# Patient Record
Sex: Female | Born: 1935 | Race: White | Hispanic: No | Smoking: Former smoker
Health system: Southern US, Community
[De-identification: ages and names within clinical notes are randomized; demographics above are authoritative.]

## PROBLEM LIST (undated history)

## (undated) DIAGNOSIS — I10 Essential (primary) hypertension: Secondary | ICD-10-CM

## (undated) DIAGNOSIS — I2510 Atherosclerotic heart disease of native coronary artery without angina pectoris: Secondary | ICD-10-CM

## (undated) DIAGNOSIS — F329 Major depressive disorder, single episode, unspecified: Secondary | ICD-10-CM

## (undated) DIAGNOSIS — F32A Depression, unspecified: Secondary | ICD-10-CM

## (undated) DIAGNOSIS — F29 Unspecified psychosis not due to a substance or known physiological condition: Secondary | ICD-10-CM

## (undated) DIAGNOSIS — M1990 Unspecified osteoarthritis, unspecified site: Secondary | ICD-10-CM

## (undated) HISTORY — PX: CORONARY ANGIOPLASTY: SHX604

## (undated) HISTORY — DX: Unspecified psychosis not due to a substance or known physiological condition: F29

---

## 2008-12-06 ENCOUNTER — Encounter: Payer: Self-pay | Admitting: Cardiology

## 2008-12-07 ENCOUNTER — Encounter: Payer: Self-pay | Admitting: Cardiology

## 2008-12-08 ENCOUNTER — Ambulatory Visit: Payer: Self-pay | Admitting: Cardiology

## 2008-12-08 ENCOUNTER — Inpatient Hospital Stay (HOSPITAL_COMMUNITY): Admission: AD | Admit: 2008-12-08 | Discharge: 2008-12-11 | Payer: Self-pay | Admitting: Cardiology

## 2008-12-09 ENCOUNTER — Encounter: Payer: Self-pay | Admitting: Cardiology

## 2008-12-14 ENCOUNTER — Encounter: Payer: Self-pay | Admitting: Cardiology

## 2008-12-19 ENCOUNTER — Ambulatory Visit: Payer: Self-pay | Admitting: Cardiology

## 2008-12-25 ENCOUNTER — Encounter: Payer: Self-pay | Admitting: Physician Assistant

## 2009-03-26 ENCOUNTER — Ambulatory Visit: Payer: Self-pay | Admitting: Cardiology

## 2009-07-03 DIAGNOSIS — R0989 Other specified symptoms and signs involving the circulatory and respiratory systems: Secondary | ICD-10-CM | POA: Insufficient documentation

## 2009-07-03 DIAGNOSIS — I10 Essential (primary) hypertension: Secondary | ICD-10-CM | POA: Insufficient documentation

## 2009-07-03 DIAGNOSIS — E785 Hyperlipidemia, unspecified: Secondary | ICD-10-CM

## 2009-07-03 DIAGNOSIS — I2510 Atherosclerotic heart disease of native coronary artery without angina pectoris: Secondary | ICD-10-CM | POA: Insufficient documentation

## 2009-08-23 ENCOUNTER — Encounter: Payer: Self-pay | Admitting: Cardiology

## 2009-10-22 ENCOUNTER — Ambulatory Visit: Payer: Self-pay | Admitting: Cardiology

## 2009-10-22 DIAGNOSIS — G4700 Insomnia, unspecified: Secondary | ICD-10-CM

## 2009-10-22 DIAGNOSIS — I259 Chronic ischemic heart disease, unspecified: Secondary | ICD-10-CM

## 2009-10-22 DIAGNOSIS — F429 Obsessive-compulsive disorder, unspecified: Secondary | ICD-10-CM | POA: Insufficient documentation

## 2009-10-22 DIAGNOSIS — G4733 Obstructive sleep apnea (adult) (pediatric): Secondary | ICD-10-CM

## 2009-12-30 ENCOUNTER — Ambulatory Visit: Payer: Self-pay | Admitting: Cardiology

## 2009-12-30 ENCOUNTER — Encounter (INDEPENDENT_AMBULATORY_CARE_PROVIDER_SITE_OTHER): Payer: Self-pay | Admitting: *Deleted

## 2010-01-02 ENCOUNTER — Encounter: Payer: Self-pay | Admitting: Cardiology

## 2010-01-02 ENCOUNTER — Ambulatory Visit: Payer: Self-pay | Admitting: Cardiology

## 2010-01-06 ENCOUNTER — Encounter (INDEPENDENT_AMBULATORY_CARE_PROVIDER_SITE_OTHER): Payer: Self-pay | Admitting: *Deleted

## 2010-08-07 ENCOUNTER — Ambulatory Visit: Payer: Self-pay | Admitting: Cardiology

## 2011-01-13 NOTE — Assessment & Plan Note (Signed)
Summary: 6 MO FU PER JULY REMINDER-SRS  Medications Added SIMVASTATIN 40 MG TABS (SIMVASTATIN) Take 1 tablet by mouth once a day      Allergies Added: NKDA  Visit Type:  Follow-up Primary Abdikadir Fohl:  [name]   History of Present Illness: the patient is a 77 year old female with a history of ischemic heart disease, normal ejection fraction and multiple cardiac risk factors.  The patient had a stress test in January of 2011 which was within normal limits with an ejection fraction of 71%.  The patient is doing well from a cardiovascular perspective.  She denies any chest pain shortness of breath orthopnea PND she has no palpitations or syncope.  Preventive Screening-Counseling & Management  Alcohol-Tobacco     Smoking Status: quit     Year Quit: 1985  Current Medications (verified): 1)  Evista 60 Mg Tabs (Raloxifene Hcl) .... Take 1 Tablet By Mouth Once A Day 2)  Aspirin 325 Mg Tabs (Aspirin) .... Take 1 Tablet By Mouth Once A Day 3)  Metoprolol Tartrate 25 Mg Tabs (Metoprolol Tartrate) .... Take 1 Tablet By Mouth Two Times A Day 4)  Simvastatin 40 Mg Tabs (Simvastatin) .... Take 1 Tablet By Mouth Once A Day 5)  Multivitamins  Tabs (Multiple Vitamin) .... Take 1 Tablet By Mouth Once A Day 6)  Vitamin B-12 100 Mcg Tabs (Cyanocobalamin) .... Take 1 Tablet By Mouth Once A Day 7)  Citalopram Hydrobromide 20 Mg Tabs (Citalopram Hydrobromide) .... Take 1 Tablet By Mouth Once A Day  Allergies (verified): No Known Drug Allergies  Comments:  Nurse/Medical Assistant: The patient's medication bottles and allergies were reviewed with the patient and were updated in the Medication and Allergy Lists.  Past History:  Past Surgical History: Last updated: 07/03/2009 Left thumb surgery.  Laser surgery to right eye in 2005 Cataract Extraction - '09  Family History: Last updated: 07/03/2009 Family History of Coronary Artery Disease:  CHF  Social History: Last updated:  07/03/2009 Retired  Widowed  Tobacco Use - Former.  - 1984 Alcohol Use - no Drug Use - no  Past Medical History: BRUIT (ICD-785.9) HYPERTENSION, UNSPECIFIED (ICD-401.9) HYPERLIPIDEMIA-MIXED (ICD-272.4) CAD, NATIVE VESSEL (ICD-414.01) Sleep apnea, obstructive 1. Severe single vessel coronary artery disease.     a.     Status post nonSTEMI/drug-eluting stent, critical 99%      stenosis in the right coronary artery in December of 2009 and      residual 60%LAD, 50% obtuse marginal 1.     b.     EF 50%, mild inferior wall hypokinesis. 2. Hypertension. 3. Dyslipidemia. 4. Remote tobacco use. Cardiolite stress test January 2011...  Ejection fraction 71% and normal perfusion. 5. Obstructive sleep apnea. 6. Right carotid bruit but normal carotid Doppler.   Social History: Smoking Status:  quit  Vital Signs:  Patient profile:   77 year old female Height:      60 inches Weight:      117 pounds BMI:     22.93 Pulse rate:   58 / minute BP sitting:   143 / 84  (left arm) Cuff size:   regular  Vitals Entered By: Carlye Grippe (August 07, 2010 1:51 PM)  Physical Exam  Additional Exam:  General: Well-developed, well-nourished in no distress head: Normocephalic and atraumatic eyes PERRLA/EOMI intact, conjunctiva and lids normal nose: No deformity or lesions mouth normal dentition, normal posterior pharynx neck: Supple, no JVD.  No masses, thyromegaly or abnormal cervical nodes. Right carotid bruit lungs: Normal breath  sounds bilaterally without wheezing.  Normal percussion heart: regular rate and rhythm with normal S1 and S2, no S3 or S4.  PMI is normal.  No pathological murmurs abdomen: Normal bowel sounds, abdomen is soft and nontender without masses, organomegaly or hernias noted.  No hepatosplenomegaly musculoskeletal: Back normal, normal gait muscle strength and tone normal pulsus: Pulse is normal in all 4 extremities Extremities: No peripheral pitting edema neurologic:  Alert and oriented x 3 skin: Intact without lesions or rashes cervical nodes: No significant adenopathy psychologic: Normal affect    Impression & Recommendations:  Problem # 1:  CORONARY ARTERY DISEASE, S/P PTCA (ICD-414.9) no recurrent chest pain.  No ischemia workup needed. Her updated medication list for this problem includes:    Aspirin 325 Mg Tabs (Aspirin) .Marland Kitchen... Take 1 tablet by mouth once a day    Metoprolol Tartrate 25 Mg Tabs (Metoprolol tartrate) .Marland Kitchen... Take 1 tablet by mouth two times a day  Problem # 2:  HYPERTENSION, UNSPECIFIED (ICD-401.9) blood pressure is well controlled. Her updated medication list for this problem includes:    Aspirin 325 Mg Tabs (Aspirin) .Marland Kitchen... Take 1 tablet by mouth once a day    Metoprolol Tartrate 25 Mg Tabs (Metoprolol tartrate) .Marland Kitchen... Take 1 tablet by mouth two times a day  Patient Instructions: 1)  Your physician recommends that you continue on your current medications as directed. Please refer to the Current Medication list given to you today. 2)  Follow up in  6 months

## 2011-01-13 NOTE — Assessment & Plan Note (Signed)
Summary: 2 mo fu -srs  Medications Added CITALOPRAM HYDROBROMIDE 20 MG TABS (CITALOPRAM HYDROBROMIDE) Take 1 tablet by mouth once a day        Visit Type:  Follow-up Primary Provider:  [name]  CC:  follow-up visit.  History of Present Illness: the patient is a 77 year old female with a history of coronary disease status post stent placement in the setting of a non-ST elevation myocardial infarction of the right coronary artery in December of 2009 the patient was last seen in November of 2010. At that time she reported no chest pain. Plavix in the interim has been discontinued at. The patient does report some chest pain underneath the straps of her bra. She does have occasional exertional chest pain.the patient reports no shortness of breath orthopnea PND. She has no palpitations or syncope. The patient did not have an ischemia evaluation since 2009. She has an ejection fraction of 50% with inferior wall hypokinesis. She denies any palpitations, presyncope or syncope.  Clinical Review Panels:  Carotid Studies Carotid Doppler Results No significant carotid plaque accumulation or stenosis (12/25/2008)  Cardiac Imaging Cardiac Cath Findings  1. Severe right coronary artery stenosis with successful percutaneous       coronary intervention using a drug-eluting stent platform.   2. Moderate left anterior descending stenosis.   3. Minimal left circumflex stenosis.   4. Mild left ventricular dysfunction with preserved overall a left       ventricular ejection fraction.      PLAN:  Recommend a minimum of 12 months' dual antiplatelet therapy with   aspirin and Plavix.  Consider outpatient Myoview for evaluation of LAD   territory ischemia.  However, I suspect Ms. Abraha will do well with   medical therapy for here forward.      Veverly Fells. Excell Seltzer, MD (12/10/2008)    Preventive Screening-Counseling & Management  Alcohol-Tobacco     Smoking Status: quit > 6 months     Year Started:  1947     Year Quit: 1985  Current Problems (verified): 1)  Insomnia  (ICD-780.52) 2)  Obsessive-compulsive Disorder  (ICD-300.3) 3)  Sleep Apnea, Obstructive  (ICD-327.23) 4)  Coronary Artery Disease, S/p Ptca  (ICD-414.9) 5)  Bruit  (ICD-785.9) 6)  Hypertension, Unspecified  (ICD-401.9) 7)  Hyperlipidemia-mixed  (ICD-272.4) 8)  Cad, Native Vessel  (ICD-414.01)  Current Medications (verified): 1)  Evista 60 Mg Tabs (Raloxifene Hcl) .... Take 1 Tablet By Mouth Once A Day 2)  Aspirin 325 Mg Tabs (Aspirin) .... Take 1 Tablet By Mouth Once A Day 3)  Metoprolol Tartrate 25 Mg Tabs (Metoprolol Tartrate) .... Take 1 Tablet By Mouth Two Times A Day 4)  Simvastatin 80 Mg Tabs (Simvastatin) .... Take 1 Tablet By Mouth Once A Day 5)  Multivitamins  Tabs (Multiple Vitamin) .... Take 1 Tablet By Mouth Once A Day 6)  Vitamin D 400 Unit Caps (Cholecalciferol) .... Take 1 Tablet By Mouth Once A Day 7)  Vitamin B-12 100 Mcg Tabs (Cyanocobalamin) .... Take 1 Tablet By Mouth Once A Day 8)  Citalopram Hydrobromide 20 Mg Tabs (Citalopram Hydrobromide) .... Take 1 Tablet By Mouth Once A Day  Allergies: No Known Drug Allergies  Comments:  Nurse/Medical Assistant: The patient's medications were reviewed with the patient and were updated in the Medication List. Pt brought prescription medication bottles to office visit. She verbally reviewed and confirmed vitamins and supplements. Cyril Loosen, RN, BSN (December 30, 2009 3:03 PM)  Past History:  Past Medical History:  Last updated: 10/22/2009 BRUIT (ICD-785.9) HYPERTENSION, UNSPECIFIED (ICD-401.9) HYPERLIPIDEMIA-MIXED (ICD-272.4) CAD, NATIVE VESSEL (ICD-414.01) Sleep apnea, obstructive 1. Severe single vessel coronary artery disease.     a.     Status post nonSTEMI/drug-eluting stent, critical 99%      stenosis in the right coronary artery in December of 2009 and      residual 60%LAD, 50% obtuse marginal 1.     b.     EF 50%, mild inferior wall  hypokinesis. 2. Hypertension. 3. Dyslipidemia. 4. Remote tobacco use. 5. Obstructive sleep apnea. 6. Right carotid bruit but normal carotid Doppler.   Past Surgical History: Last updated: 07/03/2009 Left thumb surgery.  Laser surgery to right eye in 2005 Cataract Extraction - '09  Family History: Last updated: 07/03/2009 Family History of Coronary Artery Disease:  CHF  Social History: Last updated: 07/03/2009 Retired  Widowed  Tobacco Use - Former.  - 1984 Alcohol Use - no Drug Use - no  Risk Factors: Smoking Status: quit > 6 months (12/30/2009)  Social History: Smoking Status:  quit > 6 months  Review of Systems       The patient complains of chest pain.  The patient denies fatigue, malaise, fever, weight gain/loss, vision loss, decreased hearing, hoarseness, palpitations, shortness of breath, prolonged cough, wheezing, sleep apnea, coughing up blood, abdominal pain, blood in stool, nausea, vomiting, diarrhea, heartburn, incontinence, blood in urine, muscle weakness, joint pain, leg swelling, rash, skin lesions, headache, fainting, dizziness, depression, anxiety, enlarged lymph nodes, easy bruising or bleeding, and environmental allergies.    Vital Signs:  Patient profile:   77 year old female Height:      60 inches Weight:      119.50 pounds Pulse rate:   61 / minute BP sitting:   145 / 79  (left arm)  Vitals Entered By: Cyril Loosen, RN, BSN (December 30, 2009 2:57 PM) CC: follow-up visit Comments Pt states legs hurting. Pulling-like sensation. Had a stent put into legs and they quit hurting but now they've started hurting again   Physical Exam  Additional Exam:  General: Well-developed, well-nourished in no distress head: Normocephalic and atraumatic eyes PERRLA/EOMI intact, conjunctiva and lids normal nose: No deformity or lesions mouth normal dentition, normal posterior pharynx neck: Supple, no JVD.  No masses, thyromegaly or abnormal cervical nodes.  Right carotid bruit lungs: Normal breath sounds bilaterally without wheezing.  Normal percussion heart: regular rate and rhythm with normal S1 and S2, no S3 or S4.  PMI is normal.  No pathological murmurs abdomen: Normal bowel sounds, abdomen is soft and nontender without masses, organomegaly or hernias noted.  No hepatosplenomegaly musculoskeletal: Back normal, normal gait muscle strength and tone normal pulsus: Pulse is normal in all 4 extremities Extremities: No peripheral pitting edema neurologic: Alert and oriented x 3 skin: Intact without lesions or rashes cervical nodes: No significant adenopathy psychologic: Normal affect    Impression & Recommendations:  Problem # 1:  CORONARY ARTERY DISEASE, S/P PTCA (ICD-414.9) the patient is status post prior drug-eluting stent placement. She is currently not on Plavix anymore. She does report chest pain although somewhat atypical. However the patient is at risk for late stent thrombosis and we will proceed with a Lexiscan The following medications were removed from the medication list:    Plavix 75 Mg Tabs (Clopidogrel bisulfate) .Marland Kitchen... Take 1 tablet by mouth once a day Her updated medication list for this problem includes:    Aspirin 325 Mg Tabs (Aspirin) .Marland Kitchen... Take 1 tablet by  mouth once a day    Metoprolol Tartrate 25 Mg Tabs (Metoprolol tartrate) .Marland Kitchen... Take 1 tablet by mouth two times a day  Problem # 2:  BRUIT (ICD-785.9) the patient has a right carotid bruit but had previously normal Dopplers.  Problem # 3:  SLEEP APNEA, OBSTRUCTIVE (ICD-327.23) Assessment: Comment Only  Problem # 4:  HYPERLIPIDEMIA-MIXED (ICD-272.4) patient is on high-dose statin therapy and will follow up with her primary care physician. Her updated medication list for this problem includes:    Simvastatin 80 Mg Tabs (Simvastatin) .Marland Kitchen... Take 1 tablet by mouth once a day  Other Orders: EKG w/ Interpretation (93000) Nuclear Med (Nuc Med)  Patient  Instructions: 1)  Lexiscan on meds. 2)  Follow up in  6 months.

## 2011-01-13 NOTE — Letter (Signed)
Summary: Engineer, materials at [facility]  [address]   [place], [place] [address]   Phone: [phone]  Fax: [phone]        January 06, 2010 MRN: [id number]   Judy Suarez [address] [place], [place]  [address]   Dear Ms. [name],  Your test ordered by Selena Batten has been reviewed by your physician (or physician assistant) and was found to be normal or stable. Your physician (or physician assistant) felt no changes were needed at this time.  ____ Echocardiogram  __X__ Cardiac Stress Test  ____ Lab Work  ____ Peripheral vascular study of arms, legs or neck  ____ CT scan or X-ray  ____ Lung or Breathing test  ____ Other:   Thank you.   Hoover Brunette, LPN    Duane Boston, M.D., F.A.C.C. Thressa Sheller, M.D., F.A.C.C. Oneal Grout, M.D., F.A.C.C. Cheree Ditto, M.D., F.A.C.C. Daiva Nakayama, M.D., F.A.C.C. Kenney Houseman, M.D., F.A.C.C. Jeanne Ivan, PA-C

## 2011-01-13 NOTE — Letter (Signed)
Summary: Lexiscan or Dobutamine Pharmacist, community at [facility]  [address]   [place], [place] [address]   Phone: [phone]  Fax: [phone]      [facility] Cardiovascular Services  Lexiscan or Dobutamine Cardiolite Strss Test    [place]  Appointment Date:_  Appointment Time:_  Your doctor has ordered a CARDIOLITE STRESS TEST using a medication to stimulate exercise so that you will not have to walk on the treadmill to determine the condition of your heart during stress. If you take blood pressure medication, ask your doctor if you should take it the day of your test. You should not have anything to eat or drink at least 4 hours before your test is scheduled, and no caffeine, including decaffeinated tea and coffee, chocolate, and soft drinks for 24 hours before your test.  You will need to register at the Outpatient/Main Entrance at the hospital 15 minutes before your appointment time. It is a good idea to bring a copy of your order with you. They will direct you to the Diagnostic Imaging (Radiology) Department.  You will be asked to undress from the waist up and given a hospital gown to wear, so dress comfortably from the waist down for example: Sweat pants, shorts, or skirt Rubber soled lace up shoes (tennis shoes)  Plan on about three hours from registration to release from the hospital

## 2011-01-26 ENCOUNTER — Encounter: Payer: Self-pay | Admitting: Cardiology

## 2011-01-26 ENCOUNTER — Ambulatory Visit (INDEPENDENT_AMBULATORY_CARE_PROVIDER_SITE_OTHER): Payer: Medicare Other | Admitting: Cardiology

## 2011-01-26 DIAGNOSIS — I2510 Atherosclerotic heart disease of native coronary artery without angina pectoris: Secondary | ICD-10-CM

## 2011-02-04 NOTE — Assessment & Plan Note (Signed)
Summary: 6 MO FU/NO HOSP/SRS      Allergies Added: NKDA  Visit Type:  Follow-up Primary Provider:  [name]   History of Present Illness: the patient is a 77 year old female with history of ischemic heart disease, normal ejection fraction and multiple cardiac risk factors.  The patient had a stress test done in January of 2011 which was within normal limits with an ejection fraction of 71%.  The patient has a history of severe single-vessel coronary artery disease status post drug-eluting stent placement to the right coronary artery in 2009.  She also has hypertension dyslipidemia and remote tobacco abuse.  Chest obstructive sleep apnea and a right carotid bruit but with normal carotid Dopplers. the patient is doing well.  She denies any chest pain shortness of breath orthopnea PND.  She has no palpitations or syncope.  the patient is doing well  Preventive Screening-Counseling & Management  Alcohol-Tobacco     Smoking Status: quit     Year Quit: 1985  Current Medications (verified): 1)  Aspirin 325 Mg Tabs (Aspirin) .... Take 1 Tablet By Mouth Once A Day 2)  Metoprolol Tartrate 25 Mg Tabs (Metoprolol Tartrate) .... Take 1 Tablet By Mouth Two Times A Day 3)  Simvastatin 40 Mg Tabs (Simvastatin) .... Take 1 Tablet By Mouth Once A Day 4)  Multivitamins  Tabs (Multiple Vitamin) .... Take 1 Tablet By Mouth Once A Day 5)  Vitamin B-12 100 Mcg Tabs (Cyanocobalamin) .... Take 1 Tablet By Mouth Once A Day 6)  Citalopram Hydrobromide 20 Mg Tabs (Citalopram Hydrobromide) .... Take 1 Tablet By Mouth Once A Day  Allergies (verified): No Known Drug Allergies  Comments:  Nurse/Medical Assistant: The patient's medication bottles and allergies were reviewed with the patient and were updated in the Medication and Allergy Lists.  Past History:  Past Medical History: Last updated: 08/07/2010 BRUIT (ICD-785.9) HYPERTENSION, UNSPECIFIED (ICD-401.9) HYPERLIPIDEMIA-MIXED (ICD-272.4) CAD,  NATIVE VESSEL (ICD-414.01) Sleep apnea, obstructive 1. Severe single vessel coronary artery disease.     a.     Status post nonSTEMI/drug-eluting stent, critical 99%      stenosis in the right coronary artery in December of 2009 and      residual 60%LAD, 50% obtuse marginal 1.     b.     EF 50%, mild inferior wall hypokinesis. 2. Hypertension. 3. Dyslipidemia. 4. Remote tobacco use. Cardiolite stress test January 2011...  Ejection fraction 71% and normal perfusion. 5. Obstructive sleep apnea. 6. Right carotid bruit but normal carotid Doppler.   Past Surgical History: Last updated: 07/03/2009 Left thumb surgery.  Laser surgery to right eye in 2005 Cataract Extraction - '09  Family History: Last updated: 07/03/2009 Family History of Coronary Artery Disease:  CHF  Social History: Last updated: 07/03/2009 Retired  Widowed  Tobacco Use - Former.  - 1984 Alcohol Use - no Drug Use - no  Risk Factors: Smoking Status: quit (01/26/2011)  Review of Systems  The patient denies fatigue, malaise, fever, weight gain/loss, vision loss, decreased hearing, hoarseness, chest pain, palpitations, shortness of breath, prolonged cough, wheezing, sleep apnea, coughing up blood, abdominal pain, blood in stool, nausea, vomiting, diarrhea, heartburn, incontinence, blood in urine, muscle weakness, joint pain, leg swelling, rash, skin lesions, headache, fainting, dizziness, depression, anxiety, enlarged lymph nodes, easy bruising or bleeding, and environmental allergies.    Vital Signs:  Patient profile:   77 year old female Height:      60 inches Weight:      120 pounds Pulse rate:  59 / minute BP sitting:   123 / 78  (left arm) Cuff size:   regular  Vitals Entered By: Carlye Grippe (January 26, 2011 1:45 PM)  Physical Exam  Additional Exam:  General: Well-developed, well-nourished in no distress head: Normocephalic and atraumatic eyes PERRLA/EOMI intact, conjunctiva and lids  normal nose: No deformity or lesions mouth normal dentition, normal posterior pharynx neck: Supple, no JVD.  No masses, thyromegaly or abnormal cervical nodes. Right carotid bruit lungs: Normal breath sounds bilaterally without wheezing.  Normal percussion heart: regular rate and rhythm with normal S1 and S2, no S3 or S4.  PMI is normal.  No pathological murmurs abdomen: Normal bowel sounds, abdomen is soft and nontender without masses, organomegaly or hernias noted.  No hepatosplenomegaly musculoskeletal: Back normal, normal gait muscle strength and tone normal pulsus: Pulse is normal in all 4 extremities Extremities: No peripheral pitting edema neurologic: Alert and oriented x 3 skin: Intact without lesions or rashes cervical nodes: No significant adenopathy psychologic: Normal affect    EKG  Procedure date:  01/26/2011  Findings:      sinus bradycardia.  Heart rate 57 beats/min  Impression & Recommendations:  Problem # 1:  CORONARY ARTERY DISEASE, S/P PTCA (ICD-414.9) status-post prior drug-eluting stent placement to the right coronary artery in 2009.  No recurrent chest pain.EKG was reviewed today the patient was in sinus bradycardia with no acute ischemic changes. Her updated medication list for this problem includes:    Aspirin 325 Mg Tabs (Aspirin) .Marland Kitchen... Take 1 tablet by mouth once a day    Metoprolol Tartrate 25 Mg Tabs (Metoprolol tartrate) .Marland Kitchen... Take 1 tablet by mouth two times a day  Problem # 2:  BRUIT (ICD-785.9) Dopplers without significant flow-limiting disease the patient does have a left carotid bruit.  Problem # 3:  SLEEP APNEA, OBSTRUCTIVE (ICD-327.23) Assessment: Comment Only  Other Orders: EKG w/ Interpretation (93000)  Patient Instructions: 1)  Your physician recommends that you continue on your current medications as directed. Please refer to the Current Medication list given to you today. 2)  Follow up in  6 months

## 2011-04-28 NOTE — H&P (Signed)
NAMELEONIE, Judy Suarez                  ACCOUNT NO.:  [id number]   MEDICAL RECORD NO.:  [id number]          PATIENT TYPE:  INP   LOCATION:  3736                         FACILITY:  [facility]   PHYSICIAN:  Learta Codding, MD,FACC DATE OF BIRTH:  06/12/1931   DATE OF ADMISSION:  12/08/2008  DATE OF DISCHARGE:                              HISTORY & PHYSICAL   REFERRING PHYSICIAN:  Dr. Donzetta Sprung, in [place].   REASON FOR ADMISSION:  Evaluation of substernal chest pain in a 77-year-  old female from [place], [place].   HISTORY OF PRESENT ILLNESS:  The patient is a 77 year old female with a  very strong family history of coronary artery disease.  She also has  hyperlipidemia.  The patient lives by herself.  Her husband died several  years ago from esophageal cancer.  The patient reports that over the  last several days started to have a burning sensation in her chest.  This occurred with eating as well as at rest.  There was no exertional  component associated with it.  The pain did radiate somewhat into the  neck area and was associated with a choking sensation.  She reports no  shortness of breath, orthopnea, PND, nausea or vomiting, or diaphoresis.  On December 06, 2008, the pain significantly worsened and the patient  presents to Dr. Rosann Auerbach office and an EKG showed no acute ischemic  changes.  Given her strong family history, he felt an admission was  indicated.  In the interim, the patient has had no recurrent substernal  chest pain or burning sensation in the chest.  Her EKG shows no acute  ischemic changes.  Troponins, however, were elevated on 3 separate  occasions at 0.2.  Dr. Reuel Boom discussed the patient with me yesterday  and felt given her high risk factor profile and positive troponin  despite the fact that her pain is quite atypical and more consistent  with reflux symptoms that she should undergo cardiac catheterization for  definitive diagnosis.   PAST MEDICAL  HISTORY:  1. Left thumb surgery.  2. Laser surgery to right eye in 2005 by Ashley Royalty.  3. Bilateral cataract extraction with intraocular lens implant by Dr.      Emmit Pomfret in 2009.  4. Dyslipidemia.  5. Obstructive sleep apnea, CPAP noncompliant.  6. Basal cell carcinoma of the skin.  7. Polymyalgia rheumatica, in remission, currently not on steroids.  8. Osteopenia.  9. Allergic rhinitis.  10.Vitamin D insufficiency.   MEDICATIONS ON DISCHARGE:  From [facility]:  1. Lovenox 0.75 mg/kg subcu q.12 h.  2. Nitro paste 1 inch every 8 hours.  3. Plavix 75 mg p.o. daily.  4. Enteric-coated aspirin 81 mg a day.  5. Zocor 40 mg at bedtime.  6. Pristiq 50 mg p.o. daily.   ALLERGIES:  No known drug allergies.   SOCIAL HISTORY:  The patient's husband died several years ago from  esophageal cancer.  She stopped smoking in 1984.  She does not use  alcohol or drugs.  She is retired from [employer].   FAMILY HISTORY:  Father  died of myocardial infarction, age 57.  Mother  died at 38 from congestive heart failure.  She had a brother who died at  age 47 with myocardial infarction.  Three sister died in their 84s with  myocardial infarctions.  She has an 9 year old sister who had a recent  myocardial infarction.   REVIEW OF SYSTEMS:  As outlined above.  The remainder of the 18-point  review of systems is negative.   PHYSICAL EXAMINATION:  VITAL SIGNS:  Blood pressure is 140/84, heart  rate is 80 beats per minute, weighs 128 pounds, and saturation 92% on  room air.  GENERAL:  A well-nourished white female, somewhat pale appearing, but in  no apparent distress.  HEENT:  Pupils isocoric.  Conjunctivae clear.  NECK:  Supple.  No carotid stroke and no carotid bruits.  No  thyromegaly.  No nodular thyroid.  No cervical adenopathy.  LUNGS:  Clear breath sounds bilaterally.  HEART:  Regular rate and rhythm.  Normal S1 and S2.  No pathological  murmurs.  There is no S3 or S4.  ABDOMEN:   Soft and nontender with no rebound or guarding.  Good bowel  sounds.  EXTREMITIES:  No cyanosis, clubbing, or edema.  NEUROLOGIC:  The patient is alert and oriented.  Grossly nonfocal.   LABORATORY WORK:  This is a blood work obtained in [place].  Glucose 93,  BUN 12, creatinine 0.9, potassium 4.5, and sodium 137.  BNP is 28.  Troponins are 0.20, 0.22, and 0.20 respectively.  INR is 1.1.  Hemoglobin is 15.3, hematocrit is 45.2, white count 5.9, and platelet  count is 246.  A 12-lead EKG as described above.  Chest x-ray pending.   PROBLEM LIST:  1. Atypical chest pain.      a.     Rule out acute coronary syndrome with positive troponin.      b.     Symptoms more consistent with esophageal reflux disease (may       require workup if negative cardiac catheterization).  2. Dyslipidemia.  3. Depression, on Pristiq.  4. Severe obstructive sleep apnea, CPAP noncompliant.  5. Strong family history of coronary artery disease.  6. polymyalgia rheumatica, in remission.  7. Osteoporosis, off Evista presently.  8. Allergic rhinitis.   PLAN:  1. I discussed at great length with the patient risks and benefits of      a cardiac catheterization.  Dr. Reuel Boom feels very strong that the      patient should proceed with this.  She is in agreement with this      and I have discussed the potential risks and benefit of a cardiac      catheterization.  2. We will continue her current medical regimen.  Plavix was already      started and therefore, I will continue it.      Currently, the patient is completely stable.  She has no recurrent      substernal chest pain and quite comfortable.  3. Blood work and a chest x-ray will be obtained.  4. The patient will be scheduled for diagnostic catheterization on      Monday.      Learta Codding, MD,FACC  Electronically Signed     GED/MEDQ  D:  12/08/2008  T:  12/08/2008  Job:  [id number]   cc:   Donzetta Sprung

## 2011-04-28 NOTE — Assessment & Plan Note (Signed)
[facility]                          [facility] NOTE   NAME:Judy Suarez, Judy Suarez                         MRN:          [id number]  DATE:03/26/2009                            DOB:          [birth year]    REFERRING PHYSICIAN:  Donzetta Sprung   HISTORY OF PRESENT ILLNESS:  The patient is a 77 year old female with a  recent history of non-ST-elevation myocardial infraction with drug-  eluting stent at the right coronary artery in December of 2009.  The  patient has residual LAD disease but only mild LV dysfunction, mild  inferior wall hypokinesis.  She has a history of remote tobacco use and  risk factors including hypertension and dyslipidemia.  She also has  right carotid bruit, but with essential normal carotid Dopplers.  The  patient presents for followup.  She has had __________post  catheterization.  She has no recurrence of chest pain, shortness of  breath, orthopnea, or PND.  She reports no palpitations or syncope.  The  patient does appear to be significantly depressed.  The patient was  given what appears to be a prescription of citalopram by Dr. Reuel Boom, but  the patient stated that after she took one dose, she did not feel good,  so she stopped taking this.  I encouraged her to restart the mediation,  may be up to half a dose and build it up slowly to 20 mg a day, indeed,  I agree with Dr. Reuel Boom that the patient has significant depression.   MEDICATIONS:  1. Plavix 75 mg p.o. daily.  2. __________ 60 mg p.o. daily.  3. Aspirin 325 mg daily.  4. Metoprolol 25 mg b.i.d.  5. Simvastatin 80 mg p.o. daily.  6. Multivitamin and vitamin D.   PHYSICAL EXAMINATION:  VITAL SIGNS:  Blood pressure 129/80, heart rate  61, weight 121 pounds.  NECK:  Normal carotid upstroke and no carotid bruits.  LUNGS:  Clear breath sounds bilaterally.  HEART:  Regular rate and rhythm.  Normal S1, S2.  No murmurs, rubs, or  gallops.  ABDOMEN:  Soft, nontender.   No rebound or guarding.  Good bowel sounds.  EXTREMITIES:  No cyanosis, clubbing, or edema.  NEUROLOGIC:  The patient is alert, oriented, and grossly nonfocal.   PROBLEM LIST:  1. Severe single vessel coronary artery disease.      a.     Status post nonSTEMI/drug-eluting stent, critical 99%       stenosis in the right coronary artery in December of 2009 and       residual 60%LAD, 50% obtuse marginal 1.      b.     EF 50%, mild inferior wall hypokinesis.  2. Hypertension.  3. Dyslipidemia.  4. Remote tobacco use.  5. Obstructive sleep apnea.  6. Right carotid bruit but normal carotid Doppler.   PLAN:  1. I have encouraged to the patient to get back on her citalopram, but      at lower dose.  2. From cardiovascular perspective, __________ medications are needed.  3. We will review the patient's lipid panel and  she is at goal.  She      may continue either on her current dose or half her simvastatin to      40 mg p.o. daily.     Learta Codding, MD,FACC  Electronically Signed    GED/MedQ  DD: 03/26/2009  DT: 03/27/2009  Job #: [id number]   cc:   Donzetta Sprung

## 2011-04-28 NOTE — Cardiovascular Report (Signed)
NAMEATAYA, MURDY                  ACCOUNT NO.:  [id number]   MEDICAL RECORD NO.:  [id number]          PATIENT TYPE:  INP   LOCATION:  6527                         FACILITY:  [facility]   PHYSICIAN:  Veverly Fells. Excell Seltzer, MD  DATE OF BIRTH:  04/06/1931   DATE OF PROCEDURE:  DATE OF DISCHARGE:                            CARDIAC CATHETERIZATION   PROCEDURE:  1. Left heart catheterization.  2. Selective coronary angiography.  3. Left ventricular angiography.  4. Percutaneous transluminal coronary angioplasty and stenting of the      right coronary artery.  5. Perclose of the right femoral artery.   INDICATIONS:  Ms. Brum is a 77 year old woman who presented with  typical symptoms of an acute coronary syndrome.  She ruled in for  myocardial infarction with a mildly increased troponin.  She was  transferred from [facility] and stabilized over the weekend on  fractionated heparin as well as high-dose statin, aspirin, and Plavix.  She was referred for cardiac catheterization.  She has no prior cardiac  history.   Risks and indications of the procedure were reviewed with the patient.  Informed consent was obtained.  The right groin was prepped, draped, and  anesthetized with 1% lidocaine.  Using modified Seldinger technique, a 5-  French sheath was placed in the right femoral artery.  Standard 5-French  Judkins catheters were used for coronary angiography and left  ventriculography.  At the completion of diagnostic procedure, I elected  to intervene on the right coronary artery.  There was moderate disease  in the LAD, mild disease in the left circumflex, and severe stenosis of  the right coronary artery which clearly is the patient's culprit vessel.  There is a 99% mid RCA lesion as well as an indeterminate lesion  involving the ostium of the right coronary artery.  The patient had been  preloaded with clopidogrel.  I elected to use Angiomax for  anticoagulation.  The sheath was  upsized to a 6-French sheath with an  over-the-wire exchange.  Once a therapeutic ACT was achieved, a 6-French  3-DRC guide catheter was inserted.  With the guide catheter in place,  the ostial narrowing looked less severe and it appeared nonobstructive.  There was good dye reflux and no pressure damping.  A cougar guidewire  was used to wire the vessel and it was placed in the PDA.  The vessel  was wired easily.  The lesion was predilated with a 2.5 x 15 mm Voyager  balloon.  At 8 atmospheres, the balloon was still not well expanded over  the lesion.  Therefore, I dilated it to 10 atmospheres.  Following  balloon dilatation, there was a focal dissection in the vessel.  The  balloon had appeared well expanded at 10 atmospheres.  The patient had  mild chest discomfort with this.  Angiography demonstrated TIMI III flow  in the vessel.  I elected to stent the RCA with a 2.75 x 18 mm Xience  stent which was carefully positioned and deployed at 12 atmospheres.  The stent appeared well expanded and it covered the lesion as  well as  the localized dissection completely.  Following stenting, there was an  excellent angiographic result.  The stent was then postdilated with a  3.0 x 15 mm Voyager [place] balloon which was taken to 14 atmospheres over  the distal stent and 16 atmospheres over the proximal stent.  At the  completion of the procedure, there was an excellent angiographic result  with TIMI III flow and no residual stenosis.  There was no significant  side-branch compromise.  The patient tolerated the procedure well.  Perclose device was used to close the femoral arteriotomy and there was  complete hemostasis at the completion of the procedure.   FINDINGS:  Aortic pressure 121/57 with a mean of 87 and left ventricular  pressure 127/9.   CORONARY ANGIOGRAPHY:  Left mainstem.  There is mild calcification.  There is minor nonobstructive stenosis of no more than 20%.  The left  main bifurcates  into the LAD and left circumflex.   LAD:  The LAD is a large-caliber vessel that courses down and reaches  the LV apex.  The proximal LAD has diffuse moderate range stenosis.  This begins at a large first septal perforator and extends to the second  diagonal branch.  There is diffuse 50-60% narrowing throughout that  region with moderate tandem stenoses.  There are no severe stenoses  noted.  There is moderate calcification throughout the proximal LAD.  The remaining portions of the mid and distal LAD have no significant  disease.   Left circumflex.  The left circumflex is mildly calcified.  The vessel  supplies 2 OM branches.  The AV groove circumflex is diminutive after  the first OM.  There is a 40-50% stenosis at the proximal portion of the  first OM.  This clearly appears nonobstructive.   Right coronary artery.  The right coronary artery is dominant.  It  supplies a PDA branch as well as a small posterolateral branch.  Diagnostic images were taken with the diagnostic catheter in the right  coronary cusp.  There is an appearance of ostial narrowing, but it is  difficult to tell because the catheter was not fully engaged.  The mid  right coronary artery has a critical 99% stenosis with an eccentric  plaque in that region.  The remaining portions of the vessel had minor  nonobstructive stenosis.  The PDA branch has no significant disease.  The vessel is fairly large in caliber.   Left ventriculography performed in the 30-degree RAO projection shows  mild inferior wall hypokinesis with an overall LVEF of 50%.  There is no  significant mitral regurgitation.  The aortic root appears mildly  dilated.   ASSESSMENT:  1. Severe right coronary artery stenosis with successful percutaneous      coronary intervention using a drug-eluting stent platform.  2. Moderate left anterior descending stenosis.  3. Minimal left circumflex stenosis.  4. Mild left ventricular dysfunction with  preserved overall a left      ventricular ejection fraction.   PLAN:  Recommend a minimum of 12 months' dual antiplatelet therapy with  aspirin and Plavix.  Consider outpatient Myoview for evaluation of LAD  territory ischemia.  However, I suspect Ms. Deveney will do well with  medical therapy for here forward.      Veverly Fells. Excell Seltzer, MD  Electronically Signed     MDC/MEDQ  D:  12/10/2008  T:  12/10/2008  Job:  [id number]   cc:   Learta Codding, MD,FACC

## 2011-04-28 NOTE — Discharge Summary (Signed)
NAMEROMA, BONDAR                  ACCOUNT NO.:  [id number]   MEDICAL RECORD NO.:  [id number]          PATIENT TYPE:  INP   LOCATION:  6527                         FACILITY:  [facility]   PHYSICIAN:  Veverly Fells. Excell Seltzer, MD  DATE OF BIRTH:  May 27, 1931   DATE OF ADMISSION:  12/08/2008  DATE OF DISCHARGE:  12/11/2008                               DISCHARGE SUMMARY   PRIMARY CARDIOLOGIST:  Initially was seen by Dr. Lewayne Bunting, but will be  followed by Dr. Nona Dell.   PRIMARY CARE PHYSICIAN:  Dr. Donzetta Sprung in [place].   PROCEDURES PERFORMED DURING HOSPITALIZATION:  Cardiac catheterization  completed by Dr. Tonny Bollman on December 10, 2008.  A.  Severe right coronary artery stenosis with 99% stenosis noted in the  mid RCA as well as an indeterminate lesion in the ostium of the right  coronary artery.  B.  Status post 2.75- x 18-mm XIENCE stent placed with reduction of  stenosis from 99%-0%.  C.  Moderate left anterior descending stenosis.  D.  Minimal left circumflex stenosis.  E.  Mild left ventricular dysfunction with preserved overall left  ventricular ejection fraction.   FINAL DISCHARGE DIAGNOSES:  1. Status post inferior wall myocardial infarction.  2. Dyslipidemia.  3. Obstructive sleep apnea, continuous positive airway pressure      noncompliant.  4. History of basal cell carcinoma of the skin.  5. Vitamin D insufficiency.  6. Polymyalgia rheumatica in remission, not on steroids.   HOSPITAL COURSE:  A 77 year old Caucasian female with a very strong  family history of CAD who has several days burning sensation in her  chest usually occurring after eating as well as at rest with no  exertional component.  On December 06, 2008, the patient had significant  increase in chest discomfort and was seen by Dr. Donzetta Sprung in his  office.  EKG showed no acute ischemic changes, but given her strong  family history he admitted her to [facility].  The patient's  cardiac  enzymes at [facility] were elevated on 3 separate  occasions, and the patient was referred to Dr. Lewayne Bunting to evaluate  secondary to her high risk factor profile and positive cardiac enzymes.  The patient's symptoms appeared to be more consistent with reflux, but  it was felt by Dr. Andee Lineman that she should undergo cardiac  catheterization and therefore was transferred to [facility].   The patient was seen and examined by Dr. Tonny Bollman on admission to  [facility] where she underwent cardiac catheterization.  The patient  tolerated the procedure well and was found to have a mid right coronary  artery 99% stenosis which was subsequently stented with a XIENCE drug-  eluting stent, reducing it to 0%.  Please see Dr. Casimiro Needle Cooper's  thorough cardiac catheterization note for more details.  The patient was  subsequently placed on Plavix, aspirin, low-dose Lopressor as well 0.5  mg b.i.d., and continued on her readmission Zocor.  The patient  tolerated the procedure well.  There was no evidence for bleeding,  hematoma, or signs of  infection.  The patient's blood work remained  stable along with her vital signs.  The patient was seen and examined by  Dr. Tonny Bollman on day of discharge and found to be stable.  She will  follow up with Dr. Diona Browner in the [place] office in 10 days for post PCI  evaluation and continuation of cardiac management.  In the interim, the  patient has been given prescriptions for Plavix, Lopressor, and  nitroglycerin sublingual.  The patient will have further evaluation and  lab work on followup appointments.   DISCHARGE LABORATORY DATA:  Sodium 137, potassium 4.2, chloride 105, CO2  26, BUN 8, creatinine 1.15, glucose 103.  Hemoglobin 13.4, hematocrit  40.3, white blood cells 6.4, platelets 218.  Total cholesterol 140,  triglycerides 87, HDL 35, LDL 88.   VITAL SIGNS:  Blood pressure 139/66, heart rate 76, respirations 16,  temperature 97.8.    EKG revealing normal sinus rhythm with inferior T-wave inversion noted  in 2 and 3.   DISCHARGE MEDICATIONS:  1. Zocor 40 mg at bedtime.  2. Plavix 75 mg daily (prescription provided with 11 refills).  3. Aspirin 325 mg daily.  4. Pristiq 50 mg daily.  5. Evista 60 mg daily.  6. Lopressor 12.5 mg twice a day (new prescription provided with 11      refills).  7. Nitroglycerin 0.4 mg under tongue as needed for chest pain (new      prescription provided with 11 refills).   ALLERGIES:  The patient has no known drug allergies.   FOLLOWUP PLANS AND APPOINTMENTS:  1. The patient will follow up with Dr. Nona Dell in [place] on      December 19, 2008, on Wednesday at 9:45 a.m.  2. The patient will follow up with Dr. Donzetta Sprung in his office.      She will call __________ for follow-up appointment.  3. The patient has been given post cardiac catheterization      instructions with particular emphasis on the right groin site for      evidence of bleeding, hematoma, signs of infection.  4. The patient has been advised and new prescriptions provided.   TIME SPENT WITH THE PATIENT TO INCLUDE PHYSICIAN TIME:  35 minutes.       Bettey Mare. Lyman Bishop, NP      Veverly Fells. Excell Seltzer, MD  Electronically Signed    KML/MEDQ  D:  12/11/2008  T:  12/12/2008  Job:  [id number]   cc:   Donzetta Sprung

## 2011-05-01 NOTE — Assessment & Plan Note (Signed)
[facility]                          [facility] OFFICE NOTE   NAME:Spagna, Judy Suarez                         MRN:          [id number]  DATE:12/20/2007                            DOB:          [birth year]    PRIMARY CARDIOLOGIST:  Learta Codding, MD, [facility]   REASON FOR VISIT:  Posthospital followup.   Ms. Judy Suarez is a very pleasant 77 year old female, with no prior history  of heart disease, who recently presented here at [facility] with  symptoms worrisome for unstable angina pectoris.  She was admitted  directly by Dr. Donzetta Sprung from his office.  Following initial  abnormal elevation of troponin markers, and subsequent consultation with  Dr. Lewayne Bunting, arrangements were made for the patient to be transferred  directly to [facility].  She was seen by Dr. Andee Lineman at [facility] on  admission, and subsequently underwent coronary angiography on December  28, by Dr. Tonny Bollman.  She was found to have subtotal occlusion of  the proximal right coronary artery, successfully treated with drug-  eluting stenting.  Residual anatomy notable for 60% LAD stenosis and 50%  proximal first obtuse marginal branch stenosis.  Left ventricular  function was mildly depressed at 50%, with mild inferior wall  hypokinesis.   Clinically, Judy Suarez denies any exertional chest discomfort.  However,  she has been plagued by a significant cough related to persistent nasal  congestion, absent of any fever.  She is producing whitish-colored  sputum.   CURRENT MEDICATIONS:  1. Plavix 75 daily.  2. Aspirin 325 daily.  3. Metoprolol tartrate 12.5 b.i.d.  4. Simvastatin 40 mg daily.   NOTABLE LABS:  Total cholesterol 140, triglyceride 87, HDL 35, and LDL  88.   PHYSICAL EXAMINATION:  VITAL SIGNS:  Blood pressure 130/81; pulse 91,  regular; weight 125.4; and temperature 98.9.  GENERAL:  A 77 year old female sitting upright in no distress.  HEENT:  Normocephalic and  atraumatic.  NECK:  Palpable bilateral carotid pulses with soft right carotid bruit,  none on the left.  No JVD.  LUNGS:  Clear to auscultation in all fields.  HEART:  Regular rate and rhythm.  Positive S4.  No significant murmurs.  No rubs.  ABDOMEN:  Soft, nontender with intact bowel sounds.  EXTREMITIES:  Stable right groin with no hematoma, ecchymosis, or bruit  on auscultation; tactile femoral and distal pulses.  No significant  edema.  NEURO:  No focal deficit.   IMPRESSION:  1. Severe single-vessel CAD.      a.     Status post NSTEMI/drug-eluting stenting of critical, 99% of       mid RCA, December 2009.      b.     Residual 60% LAD; 50% OM1.      c.     EF 50%; mild inferior wall HK.  2. Hypertension.  3. Dyslipidemia.  4. Remote tobacco.  5. Obstructive sleep apnea.  6. Right carotid bruit.   PLAN:  1. Adjust medications with up titration of Lopressor to 25 mg b.i.d.,  for better control of high basal heart rate and modulation of      mildly elevated blood pressure.  Increase Zocor to full dose 80 mg      daily, for aggressive lipid management with target LDL of 70 or      less, if possible.  2. Follow up fasting lipid/liver profile in 12 weeks.  3. Schedule carotid Dopplers to rule out significant carotid artery      disease.  4. Schedule early clinic followup with myself and Dr. Andee Lineman in 2      months.  The patient is also advised to schedule early followup      with her primary physician, Dr. Donzetta Sprung, with respect to her      persistent cough and nasal congestion.      Rozell Searing, PA-C  Electronically Signed      Jonelle Sidle, MD  Electronically Signed   GS/MedQ  DD: 12/19/2008  DT: 12/19/2008  Job #: [id number]   cc:   Donzetta Sprung

## 2011-09-18 LAB — CBC
HCT: 38.5 % (ref 36.0–46.0)
Hemoglobin: 13.4 g/dL (ref 12.0–15.0)
MCHC: 33.3 g/dL (ref 30.0–36.0)
MCV: 91.1 fL (ref 78.0–100.0)
MCV: 91.4 fL (ref 78.0–100.0)
RBC: 4.22 MIL/uL (ref 3.87–5.11)
RBC: 4.35 MIL/uL (ref 3.87–5.11)
RBC: 4.43 MIL/uL (ref 3.87–5.11)
WBC: 6.2 10*3/uL (ref 4.0–10.5)
WBC: 6.2 10*3/uL (ref 4.0–10.5)
WBC: 6.4 10*3/uL (ref 4.0–10.5)

## 2011-09-18 LAB — CARDIAC PANEL(CRET KIN+CKTOT+MB+TROPI)
Relative Index: INVALID (ref 0.0–2.5)
Total CK: 71 U/L (ref 7–177)

## 2011-09-18 LAB — COMPREHENSIVE METABOLIC PANEL
ALT: 12 U/L (ref 0–35)
AST: 14 U/L (ref 0–37)
CO2: 24 mEq/L (ref 19–32)
Chloride: 104 mEq/L (ref 96–112)
GFR calc Af Amer: >60 mL/min (ref >60)
GFR calc non Af Amer: 56 mL/min — ABNORMAL LOW (ref >60)
Sodium: 137 mEq/L (ref 135–145)
Total Bilirubin: 0.7 mg/dL (ref 0.3–1.2)

## 2011-09-18 LAB — HEPARIN LEVEL (UNFRACTIONATED): Heparin Unfractionated: 0.61 IU/mL (ref 0.30–0.70)

## 2011-09-18 LAB — BASIC METABOLIC PANEL
CO2: 26 mEq/L (ref 19–32)
Calcium: 9.2 mg/dL (ref 8.4–10.5)
Chloride: 108 mEq/L (ref 96–112)
Creatinine, Ser: 0.87 mg/dL (ref 0.4–1.2)
GFR calc Af Amer: 56 mL/min — ABNORMAL LOW (ref >60)
GFR calc Af Amer: >60 mL/min (ref >60)
Potassium: 3.9 mEq/L (ref 3.5–5.1)
Sodium: 137 mEq/L (ref 135–145)

## 2011-09-18 LAB — LIPID PANEL: VLDL: 17 mg/dL (ref 0–40)

## 2013-09-28 ENCOUNTER — Ambulatory Visit: Payer: Medicare Other | Attending: *Deleted

## 2013-09-28 DIAGNOSIS — M6281 Muscle weakness (generalized): Secondary | ICD-10-CM | POA: Insufficient documentation

## 2013-09-28 DIAGNOSIS — IMO0001 Reserved for inherently not codable concepts without codable children: Secondary | ICD-10-CM | POA: Insufficient documentation

## 2013-09-28 DIAGNOSIS — Z9181 History of falling: Secondary | ICD-10-CM | POA: Insufficient documentation

## 2013-09-28 DIAGNOSIS — R5381 Other malaise: Secondary | ICD-10-CM | POA: Insufficient documentation

## 2013-09-28 DIAGNOSIS — R269 Unspecified abnormalities of gait and mobility: Secondary | ICD-10-CM | POA: Insufficient documentation

## 2013-10-03 ENCOUNTER — Ambulatory Visit: Payer: Medicare Other

## 2013-10-05 ENCOUNTER — Ambulatory Visit: Payer: Medicare Other | Admitting: Physical Therapy

## 2013-10-10 ENCOUNTER — Ambulatory Visit: Payer: Medicare Other | Admitting: *Deleted

## 2013-10-12 ENCOUNTER — Ambulatory Visit: Payer: Medicare Other

## 2013-10-17 ENCOUNTER — Ambulatory Visit: Payer: Medicare Other | Attending: *Deleted | Admitting: Physical Therapy

## 2013-10-17 DIAGNOSIS — IMO0001 Reserved for inherently not codable concepts without codable children: Secondary | ICD-10-CM | POA: Insufficient documentation

## 2013-10-17 DIAGNOSIS — R269 Unspecified abnormalities of gait and mobility: Secondary | ICD-10-CM | POA: Insufficient documentation

## 2013-10-17 DIAGNOSIS — R5381 Other malaise: Secondary | ICD-10-CM | POA: Insufficient documentation

## 2013-10-17 DIAGNOSIS — Z9181 History of falling: Secondary | ICD-10-CM | POA: Insufficient documentation

## 2013-10-17 DIAGNOSIS — M6281 Muscle weakness (generalized): Secondary | ICD-10-CM | POA: Insufficient documentation

## 2013-10-19 ENCOUNTER — Ambulatory Visit: Payer: Medicare Other | Admitting: Physical Therapy

## 2013-10-24 ENCOUNTER — Ambulatory Visit: Payer: Medicare Other

## 2013-10-25 ENCOUNTER — Encounter (HOSPITAL_COMMUNITY): Payer: Self-pay | Admitting: Emergency Medicine

## 2013-10-25 ENCOUNTER — Emergency Department (HOSPITAL_COMMUNITY)
Admission: EM | Admit: 2013-10-25 | Discharge: 2013-10-26 | Disposition: A | Discharge status: Home / Self Care | Payer: Medicare Other | Attending: Emergency Medicine | Admitting: Emergency Medicine

## 2013-10-25 ENCOUNTER — Emergency Department (HOSPITAL_COMMUNITY): Payer: Medicare Other

## 2013-10-25 DIAGNOSIS — Y929 Unspecified place or not applicable: Secondary | ICD-10-CM | POA: Insufficient documentation

## 2013-10-25 DIAGNOSIS — Z7982 Long term (current) use of aspirin: Secondary | ICD-10-CM | POA: Insufficient documentation

## 2013-10-25 DIAGNOSIS — Z79899 Other long term (current) drug therapy: Secondary | ICD-10-CM | POA: Insufficient documentation

## 2013-10-25 DIAGNOSIS — S5291XC Unspecified fracture of right forearm, initial encounter for open fracture type IIIA, IIIB, or IIIC: Secondary | ICD-10-CM

## 2013-10-25 DIAGNOSIS — F329 Major depressive disorder, single episode, unspecified: Secondary | ICD-10-CM | POA: Insufficient documentation

## 2013-10-25 DIAGNOSIS — I10 Essential (primary) hypertension: Secondary | ICD-10-CM | POA: Insufficient documentation

## 2013-10-25 DIAGNOSIS — F3289 Other specified depressive episodes: Secondary | ICD-10-CM | POA: Insufficient documentation

## 2013-10-25 DIAGNOSIS — Z23 Encounter for immunization: Secondary | ICD-10-CM | POA: Insufficient documentation

## 2013-10-25 DIAGNOSIS — S52509B Unspecified fracture of the lower end of unspecified radius, initial encounter for open fracture type I or II: Secondary | ICD-10-CM | POA: Insufficient documentation

## 2013-10-25 DIAGNOSIS — Y9301 Activity, walking, marching and hiking: Secondary | ICD-10-CM | POA: Insufficient documentation

## 2013-10-25 DIAGNOSIS — W010XXA Fall on same level from slipping, tripping and stumbling without subsequent striking against object, initial encounter: Secondary | ICD-10-CM | POA: Insufficient documentation

## 2013-10-25 HISTORY — DX: Depression, unspecified: F32.A

## 2013-10-25 HISTORY — DX: Major depressive disorder, single episode, unspecified: F32.9

## 2013-10-25 HISTORY — DX: Essential (primary) hypertension: I10

## 2013-10-25 LAB — POCT I-STAT, CHEM 8
BUN: 27 mg/dL — ABNORMAL HIGH (ref 6–23)
Calcium, Ion: 1.3 mmol/L (ref 1.13–1.30)
Chloride: 100 meq/L (ref 96–112)
Creatinine, Ser: 1.4 mg/dL — ABNORMAL HIGH (ref 0.50–1.10)
Glucose, Bld: 134 mg/dL — ABNORMAL HIGH (ref 70–99)
HCT: 45 % (ref 36.0–46.0)
Hemoglobin: 15.3 g/dL — ABNORMAL HIGH (ref 12.0–15.0)
Potassium: 4.5 meq/L (ref 3.5–5.1)
Sodium: 134 meq/L — ABNORMAL LOW (ref 135–145)
TCO2: 25 mmol/L (ref 0–100)

## 2013-10-25 MED ORDER — FENTANYL CITRATE 0.05 MG/ML IJ SOLN
50.0000 ug | INTRAMUSCULAR | Status: DC | PRN
  Administered 2013-10-26: 50 ug via INTRAVENOUS
  Filled 2013-10-25: qty 2

## 2013-10-25 MED ORDER — TETANUS-DIPHTH-ACELL PERTUSSIS 5-2.5-18.5 LF-MCG/0.5 IM SUSP
0.5000 mL | Freq: Once | INTRAMUSCULAR | Status: AC
  Administered 2013-10-26: 0.5 mL via INTRAMUSCULAR
  Filled 2013-10-25: qty 0.5

## 2013-10-25 MED ORDER — SODIUM CHLORIDE 0.9 % IV SOLN
3.0000 g | Freq: Once | INTRAVENOUS | Status: AC
  Administered 2013-10-25: 3 g via INTRAVENOUS
  Filled 2013-10-25: qty 3

## 2013-10-25 NOTE — ED Provider Notes (Signed)
CSN: [id number]     Arrival date & time 10/25/13  1831 History   First MD Initiated Contact with Patient 10/25/13 2327     Chief Complaint  Patient presents with  . Fall  . Arm Injury   (Consider location/radiation/quality/duration/timing/severity/associated sxs/prior Treatment) HPI Hx per PT and family, around 4pm fell, slipped on gravel while walking, injured R FA. Was evaluated at an Urgent Care and was referred here after imaging was performed - MD there PT that we would have access to her films.  Sharp severe pain with R FA deformity and 2 areas of open skin. She denies Head injury or neck pain, it hurts to move her FA but denies any weakness or numbness. No elbow or shoulder pain. Bleeding from her wounds lateral wrist controlled prior to arrival.  Past Medical History  Diagnosis Date  . Hypertension   . Depression    History reviewed. No pertinent past surgical history. History reviewed. No pertinent family history. History  Substance Use Topics  . Smoking status: Never Smoker   . Smokeless tobacco: Not on file  . Alcohol Use: No   OB History   Grav Para Term Preterm Abortions TAB SAB Ect Mult Living                 Review of Systems  Constitutional: Negative for fever and chills.  Eyes: Negative for visual disturbance.  Respiratory: Negative for shortness of breath.   Cardiovascular: Negative for chest pain.  Gastrointestinal: Negative for abdominal pain.  Genitourinary: Negative for dysuria.  Musculoskeletal: Negative for back pain, neck pain and neck stiffness.  Skin: Positive for wound.  Neurological: Negative for headaches.  All other systems reviewed and are negative.    Allergies  Review of patient's allergies indicates no known allergies.  Home Medications   Current Outpatient Rx  Name  Route  Sig  Dispense  Refill  . aspirin 325 MG EC tablet   Oral   Take 325 mg by mouth daily.         . Calcium Carb-Cholecalciferol (CALCIUM 600 + D PO)  Oral   Take 1 tablet by mouth daily.         . Cyanocobalamin (B-12 PO)   Oral   Take 1 tablet by mouth daily.         Marland Kitchen escitalopram (LEXAPRO) 10 MG tablet   Oral   Take 10 mg by mouth daily.         . metoprolol tartrate (LOPRESSOR) 25 MG tablet   Oral   Take 25 mg by mouth 2 (two) times daily.         . naproxen sodium (ANAPROX) 220 MG tablet   Oral   Take 440 mg by mouth daily as needed (for pain).         . risperiDONE (RISPERDAL) 1 MG tablet   Oral   Take 1 mg by mouth at bedtime.          BP 116/66  Pulse 78  Temp(Src) 97.5 F (36.4 C) (Oral)  Resp 18  SpO2 98% Physical Exam  Constitutional: She is oriented to person, place, and time. She appears well-developed and well-nourished.  HENT:  Head: Normocephalic and atraumatic.  Eyes: EOM are normal. Pupils are equal, round, and reactive to light.  Neck: Neck supple.  NO C spine tenderness or deformity  Cardiovascular: Normal rate, regular rhythm and intact distal pulses.   Pulmonary/Chest: Effort normal and breath sounds normal. No respiratory distress.  Abdominal: Soft. Bowel sounds are normal. She exhibits no distension. There is no tenderness.  Musculoskeletal:  R FA with obvious distal deformity, ecchymosis, swelling and 2 areas of full thickness laceration concerning for open FX. Distal sensorium, motor and pulses intact.   nontender over R elbow and shoulder  Neurological: She is alert and oriented to person, place, and time.  Skin: Skin is warm and dry.    ED Course  Reduction of fracture Date/Time: 10/26/2013 1:25 AM Performed by: Sunnie Nielsen Authorized by: Sunnie Nielsen Consent: Verbal consent obtained. Risks and benefits: risks, benefits and alternatives were discussed Consent given by: patient Patient understanding: patient states understanding of the procedure being performed Patient consent: the patient's understanding of the procedure matches consent given Procedure consent:  procedure consent matches procedure scheduled Required items: required blood products, implants, devices, and special equipment available Patient identity confirmed: verbally with patient Time out: Immediately prior to procedure a "time out" was called to verify the correct patient, procedure, equipment, support staff and site/side marked as required. Local anesthesia used: yes Anesthesia: hematoma block Local anesthetic: lidocaine 1% without epinephrine Anesthetic total: 10 ml Patient tolerance: Patient tolerated the procedure well with no immediate complications. Comments: Adequate anesthesia achieved and sterile dressing placed.  Traction applied and fracture reduced. I assisted [employer] with splinting. Postreduction film ordered.   (including critical care time) Labs Review Labs Reviewed  CBC - Abnormal; Notable for the following:    WBC 14.9 (*)    All other components within normal limits  POCT I-STAT, CHEM 8 - Abnormal; Notable for the following:    Sodium 134 (*)    BUN 27 (*)    Creatinine, Ser 1.40 (*)    Glucose, Bld 134 (*)    Hemoglobin 15.3 (*)    All other components within normal limits   Imaging Review Dg Forearm Right  10/26/2013   CLINICAL DATA:  Status post fall; right wrist pain.  EXAM: RIGHT FOREARM - 2 VIEW  COMPARISON:  None  FINDINGS: Significantly displaced comminuted fractures involving the distal radius and ulna are again seen. There is rotation of the distal wrist, with more than 1 shaft width displacement of the distal radius.  No additional fractures are seen. The carpal rows articulate with the displaced radial fragment. The elbow joint is grossly unremarkable in appearance; no elbow joint effusion is identified. Soft tissue swelling is noted about the distal forearm.  IMPRESSION: Significantly displaced comminuted fractures involving the distal radius and ulna, with more than 1 shaft width displacement of the distal radius, and rotation of the distal  wrist.   Electronically Signed   By: Roanna Raider M.D.   On: 10/26/2013 00:55    Epic records reviewed no imaging from earlier today available for review.  11:48 PM IV ABx, wound care and repeat imaging.  Hand surgeon notified, discussed as above.    Plan NPO, to see Dr Orlan Leavens in his office tomorrow for wash out and surgery. Rx keflex. Will reduce and splint now.   Patient and family state understanding all discharge and followup instructions.   MDM  Diagnosis: Open right forearm fracture  Hematoma block and attempted fracture reduction in the ED. Splinting. X-rays reviewed as above. Labs obtained and reviewed. IV antibiotics. Hand consult and followup for wash out and operative repair    Sunnie Nielsen, MD 10/26/13 [phone]

## 2013-10-25 NOTE — ED Notes (Signed)
Portable xray at bedside.

## 2013-10-25 NOTE — ED Notes (Signed)
Pt c/o right wrist injury from fall today; pt sent here for eval of right arm fracture; CMS intact in splint

## 2013-10-26 ENCOUNTER — Emergency Department (HOSPITAL_COMMUNITY): Payer: Medicare Other

## 2013-10-26 ENCOUNTER — Observation Stay (HOSPITAL_COMMUNITY)
Admission: RE | Admit: 2013-10-26 | Discharge: 2013-10-27 | Disposition: A | Discharge status: Home / Self Care | Payer: Medicare Other | Source: Ambulatory Visit | Attending: Orthopedic Surgery | Admitting: Orthopedic Surgery

## 2013-10-26 ENCOUNTER — Encounter (HOSPITAL_COMMUNITY): Payer: Self-pay | Admitting: Anesthesiology

## 2013-10-26 ENCOUNTER — Encounter (HOSPITAL_COMMUNITY)
Admission: RE | Disposition: A | Discharge status: Home / Self Care | Payer: Self-pay | Source: Ambulatory Visit | Attending: Orthopedic Surgery

## 2013-10-26 ENCOUNTER — Inpatient Hospital Stay (HOSPITAL_COMMUNITY): Payer: Medicare Other | Admitting: Anesthesiology

## 2013-10-26 ENCOUNTER — Inpatient Hospital Stay (HOSPITAL_COMMUNITY): Payer: Medicare Other

## 2013-10-26 ENCOUNTER — Encounter (HOSPITAL_COMMUNITY): Payer: Medicare Other | Admitting: Anesthesiology

## 2013-10-26 ENCOUNTER — Encounter: Payer: Private Health Insurance - Indemnity | Admitting: Physical Therapy

## 2013-10-26 DIAGNOSIS — S52501B Unspecified fracture of the lower end of right radius, initial encounter for open fracture type I or II: Secondary | ICD-10-CM

## 2013-10-26 HISTORY — PX: ORIF WRIST FRACTURE: SHX2133

## 2013-10-26 HISTORY — DX: Atherosclerotic heart disease of native coronary artery without angina pectoris: I25.10

## 2013-10-26 HISTORY — DX: Unspecified osteoarthritis, unspecified site: M19.90

## 2013-10-26 LAB — CBC
HCT: 41.9 % (ref 36.0–46.0)
MCH: 31.5 pg (ref 26.0–34.0)
MCHC: 35.1 g/dL (ref 30.0–36.0)
MCV: 89.9 fL (ref 78.0–100.0)
RBC: 4.66 MIL/uL (ref 3.87–5.11)
RDW: 13.2 % (ref 11.5–15.5)
WBC: 14.9 10*3/uL — ABNORMAL HIGH (ref 4.0–10.5)

## 2013-10-26 LAB — BASIC METABOLIC PANEL
CO2: 23 mEq/L (ref 19–32)
Calcium: 10.6 mg/dL — ABNORMAL HIGH (ref 8.4–10.5)
Creatinine, Ser: 0.97 mg/dL (ref 0.50–1.10)
Glucose, Bld: 109 mg/dL — ABNORMAL HIGH (ref 70–99)

## 2013-10-26 SURGERY — OPEN REDUCTION INTERNAL FIXATION (ORIF) WRIST FRACTURE
Anesthesia: General | Site: Wrist | Laterality: Right | Wound class: Dirty or Infected

## 2013-10-26 MED ORDER — ESCITALOPRAM OXALATE 10 MG PO TABS
10.0000 mg | ORAL_TABLET | Freq: Every day | ORAL | Status: DC
  Administered 2013-10-26 – 2013-10-27 (×2): 10 mg via ORAL
  Filled 2013-10-26 (×2): qty 1

## 2013-10-26 MED ORDER — CEFAZOLIN SODIUM-DEXTROSE 2-3 GM-% IV SOLR
INTRAVENOUS | Status: AC
  Administered 2013-10-26: 2 g via INTRAVENOUS
  Filled 2013-10-26: qty 50

## 2013-10-26 MED ORDER — MORPHINE SULFATE 2 MG/ML IJ SOLN: 1.0000 mg | INTRAMUSCULAR | Status: DC | PRN

## 2013-10-26 MED ORDER — OXYCODONE HCL 5 MG/5ML PO SOLN: 5.0000 mg | Freq: Once | ORAL | Status: AC | PRN

## 2013-10-26 MED ORDER — FENTANYL CITRATE 0.05 MG/ML IJ SOLN: 50.0000 ug | Freq: Once | INTRAMUSCULAR | Status: DC

## 2013-10-26 MED ORDER — LIDOCAINE HCL (CARDIAC) 20 MG/ML IV SOLN
INTRAVENOUS | Status: DC | PRN
  Administered 2013-10-26: 60 mg via INTRAVENOUS

## 2013-10-26 MED ORDER — DIPHENHYDRAMINE HCL 25 MG PO CAPS: 25.0000 mg | ORAL_CAPSULE | Freq: Four times a day (QID) | ORAL | Status: DC | PRN

## 2013-10-26 MED ORDER — CEFAZOLIN SODIUM 1-5 GM-% IV SOLN
1.0000 g | INTRAVENOUS | Status: AC
  Administered 2013-10-26: 1 g via INTRAVENOUS
  Filled 2013-10-26: qty 50

## 2013-10-26 MED ORDER — DOCUSATE SODIUM 100 MG PO CAPS: 100.0000 mg | ORAL_CAPSULE | Freq: Two times a day (BID) | ORAL | Status: AC

## 2013-10-26 MED ORDER — ONDANSETRON HCL 4 MG/2ML IJ SOLN
INTRAMUSCULAR | Status: DC | PRN
  Administered 2013-10-26: 4 mg via INTRAVENOUS

## 2013-10-26 MED ORDER — VITAMIN C 500 MG PO TABS: 500.0000 mg | ORAL_TABLET | Freq: Every day | ORAL | Status: AC

## 2013-10-26 MED ORDER — ASPIRIN EC 325 MG PO TBEC
325.0000 mg | DELAYED_RELEASE_TABLET | Freq: Every day | ORAL | Status: DC
  Administered 2013-10-26 – 2013-10-27 (×2): 325 mg via ORAL
  Filled 2013-10-26 (×2): qty 1

## 2013-10-26 MED ORDER — EPHEDRINE SULFATE 50 MG/ML IJ SOLN
INTRAMUSCULAR | Status: DC | PRN
  Administered 2013-10-26: 10 mg via INTRAVENOUS

## 2013-10-26 MED ORDER — LACTATED RINGERS IV SOLN
INTRAVENOUS | Status: DC
  Administered 2013-10-26: 15:00:00 via INTRAVENOUS

## 2013-10-26 MED ORDER — PROPOFOL 10 MG/ML IV BOLUS
INTRAVENOUS | Status: DC | PRN
  Administered 2013-10-26: 140 mg via INTRAVENOUS

## 2013-10-26 MED ORDER — DOCUSATE SODIUM 100 MG PO CAPS
100.0000 mg | ORAL_CAPSULE | Freq: Two times a day (BID) | ORAL | Status: DC
  Administered 2013-10-26 – 2013-10-27 (×2): 100 mg via ORAL
  Filled 2013-10-26 (×3): qty 1

## 2013-10-26 MED ORDER — ONDANSETRON HCL 4 MG PO TABS: 4.0000 mg | ORAL_TABLET | Freq: Four times a day (QID) | ORAL | Status: DC | PRN

## 2013-10-26 MED ORDER — METOPROLOL TARTRATE 12.5 MG HALF TABLET
ORAL_TABLET | ORAL | Status: AC
  Filled 2013-10-26: qty 2

## 2013-10-26 MED ORDER — ONDANSETRON HCL 4 MG/2ML IJ SOLN: 4.0000 mg | Freq: Four times a day (QID) | INTRAMUSCULAR | Status: DC | PRN

## 2013-10-26 MED ORDER — METHOCARBAMOL 500 MG PO TABS: 500.0000 mg | ORAL_TABLET | Freq: Four times a day (QID) | ORAL | Status: DC | PRN

## 2013-10-26 MED ORDER — VITAMIN C 500 MG PO TABS
1000.0000 mg | ORAL_TABLET | Freq: Every day | ORAL | Status: DC
  Administered 2013-10-26 – 2013-10-27 (×2): 1000 mg via ORAL
  Filled 2013-10-26 (×2): qty 2

## 2013-10-26 MED ORDER — MIDAZOLAM HCL 2 MG/2ML IJ SOLN: 1.0000 mg | INTRAMUSCULAR | Status: DC | PRN

## 2013-10-26 MED ORDER — METOPROLOL TARTRATE 25 MG PO TABS
25.0000 mg | ORAL_TABLET | Freq: Two times a day (BID) | ORAL | Status: DC
  Administered 2013-10-26 – 2013-10-27 (×2): 25 mg via ORAL
  Filled 2013-10-26 (×3): qty 1

## 2013-10-26 MED ORDER — LIDOCAINE HCL (PF) 1 % IJ SOLN
INTRAMUSCULAR | Status: AC
  Administered 2013-10-26: 10 mL
  Filled 2013-10-26: qty 10

## 2013-10-26 MED ORDER — HYDROCODONE-ACETAMINOPHEN 5-325 MG PO TABS
2.0000 | ORAL_TABLET | Freq: Once | ORAL | Status: AC
  Administered 2013-10-26: 2 via ORAL
  Filled 2013-10-26: qty 2

## 2013-10-26 MED ORDER — FENTANYL CITRATE 0.05 MG/ML IJ SOLN
INTRAMUSCULAR | Status: DC | PRN
  Administered 2013-10-26: 50 ug via INTRAVENOUS
  Administered 2013-10-26 (×3): 25 ug via INTRAVENOUS

## 2013-10-26 MED ORDER — FENTANYL CITRATE 0.05 MG/ML IJ SOLN: 25.0000 ug | INTRAMUSCULAR | Status: DC | PRN

## 2013-10-26 MED ORDER — RISPERIDONE 1 MG PO TABS
1.0000 mg | ORAL_TABLET | Freq: Every day | ORAL | Status: DC
  Administered 2013-10-26: 1 mg via ORAL
  Filled 2013-10-26 (×2): qty 1

## 2013-10-26 MED ORDER — OXYCODONE-ACETAMINOPHEN 5-325 MG PO TABS: 1.0000 | ORAL_TABLET | ORAL | Status: DC | PRN

## 2013-10-26 MED ORDER — BUPIVACAINE HCL (PF) 0.25 % IJ SOLN
INTRAMUSCULAR | Status: AC
  Filled 2013-10-26: qty 30

## 2013-10-26 MED ORDER — CEPHALEXIN 500 MG PO CAPS: 500.0000 mg | ORAL_CAPSULE | Freq: Four times a day (QID) | ORAL | Status: DC

## 2013-10-26 MED ORDER — HYDROCODONE-ACETAMINOPHEN 5-325 MG PO TABS: 1.0000 | ORAL_TABLET | ORAL | Status: DC | PRN

## 2013-10-26 MED ORDER — METHOCARBAMOL 100 MG/ML IJ SOLN
500.0000 mg | Freq: Four times a day (QID) | INTRAMUSCULAR | Status: DC | PRN
  Filled 2013-10-26: qty 5

## 2013-10-26 MED ORDER — OXYCODONE HCL 5 MG PO TABS: 5.0000 mg | ORAL_TABLET | Freq: Once | ORAL | Status: AC | PRN

## 2013-10-26 MED ORDER — LACTATED RINGERS IV SOLN
INTRAVENOUS | Status: DC | PRN
  Administered 2013-10-26 (×2): via INTRAVENOUS

## 2013-10-26 MED ORDER — ARTIFICIAL TEARS OP OINT
TOPICAL_OINTMENT | OPHTHALMIC | Status: DC | PRN
  Administered 2013-10-26: 1 via OPHTHALMIC

## 2013-10-26 MED ORDER — METOPROLOL TARTRATE 25 MG PO TABS
25.0000 mg | ORAL_TABLET | Freq: Once | ORAL | Status: AC
  Administered 2013-10-26: 25 mg via ORAL

## 2013-10-26 MED ORDER — HYDROCODONE-ACETAMINOPHEN 5-325 MG PO TABS: 1.0000 | ORAL_TABLET | Freq: Four times a day (QID) | ORAL | Status: DC | PRN

## 2013-10-26 MED ORDER — CEFAZOLIN SODIUM 1-5 GM-% IV SOLN
1.0000 g | Freq: Three times a day (TID) | INTRAVENOUS | Status: DC
  Administered 2013-10-27 (×2): 1 g via INTRAVENOUS
  Filled 2013-10-26 (×4): qty 50

## 2013-10-26 MED ORDER — KCL IN DEXTROSE-NACL 20-5-0.45 MEQ/L-%-% IV SOLN
INTRAVENOUS | Status: DC
  Administered 2013-10-26: 21:00:00 via INTRAVENOUS
  Filled 2013-10-26 (×3): qty 1000

## 2013-10-26 MED ORDER — ASPIRIN EC 325 MG PO TBEC: 325.0000 mg | DELAYED_RELEASE_TABLET | Freq: Every day | ORAL | Status: DC

## 2013-10-26 MED ORDER — 0.9 % SODIUM CHLORIDE (POUR BTL) OPTIME
TOPICAL | Status: DC | PRN
  Administered 2013-10-26: 1000 mL

## 2013-10-26 SURGICAL SUPPLY — 67 items
BANDAGE ELASTIC 3 VELCRO ST LF (GAUZE/BANDAGES/DRESSINGS) ×2 IMPLANT
BANDAGE ELASTIC 4 VELCRO ST LF (GAUZE/BANDAGES/DRESSINGS) ×2 IMPLANT
BANDAGE GAUZE ELAST BULKY 4 IN (GAUZE/BANDAGES/DRESSINGS) ×2 IMPLANT
BIT DRILL 1.1 MINI QC NONSTRL (BIT) ×2 IMPLANT
BIT DRILL 2.2 SS TIBIAL (BIT) ×2 IMPLANT
BLADE SURG ROTATE 9660 (MISCELLANEOUS) IMPLANT
BNDG COHESIVE 6X5 TAN STRL LF (GAUZE/BANDAGES/DRESSINGS) ×2 IMPLANT
BNDG ESMARK 4X9 LF (GAUZE/BANDAGES/DRESSINGS) ×2 IMPLANT
CLOTH BEACON ORANGE TIMEOUT ST (SAFETY) ×2 IMPLANT
CORDS BIPOLAR (ELECTRODE) ×2 IMPLANT
COVER SURGICAL LIGHT HANDLE (MISCELLANEOUS) ×2 IMPLANT
CUFF TOURNIQUET SINGLE 18IN (TOURNIQUET CUFF) ×2 IMPLANT
CUFF TOURNIQUET SINGLE 24IN (TOURNIQUET CUFF) IMPLANT
DRAIN TLS ROUND 10FR (DRAIN) IMPLANT
DRAPE OEC MINIVIEW 54X84 (DRAPES) ×2 IMPLANT
DRAPE SURG 17X11 SM STRL (DRAPES) ×2 IMPLANT
DRSG ADAPTIC 3X8 NADH LF (GAUZE/BANDAGES/DRESSINGS) ×2 IMPLANT
ELECT REM PT RETURN 9FT ADLT (ELECTROSURGICAL) ×2
ELECTRODE REM PT RTRN 9FT ADLT (ELECTROSURGICAL) ×1 IMPLANT
GAUZE XEROFORM 5X9 LF (GAUZE/BANDAGES/DRESSINGS) ×2 IMPLANT
GLOVE BIOGEL PI IND STRL 8.5 (GLOVE) ×1 IMPLANT
GLOVE BIOGEL PI INDICATOR 8.5 (GLOVE) ×1
GLOVE SURG ORTHO 8.0 STRL STRW (GLOVE) ×2 IMPLANT
GOWN PREVENTION PLUS XLARGE (GOWN DISPOSABLE) ×2 IMPLANT
GOWN STRL NON-REIN LRG LVL3 (GOWN DISPOSABLE) ×2 IMPLANT
KIT BASIN OR (CUSTOM PROCEDURE TRAY) ×2 IMPLANT
KIT ROOM TURNOVER OR (KITS) ×2 IMPLANT
MANIFOLD NEPTUNE II (INSTRUMENTS) ×2 IMPLANT
NEEDLE HYPO 25X1 1.5 SAFETY (NEEDLE) ×2 IMPLANT
NS IRRIG 1000ML POUR BTL (IV SOLUTION) ×2 IMPLANT
PACK ORTHO EXTREMITY (CUSTOM PROCEDURE TRAY) ×2 IMPLANT
PAD ARMBOARD 7.5X6 YLW CONV (MISCELLANEOUS) ×4 IMPLANT
PAD CAST 3X4 CTTN HI CHSV (CAST SUPPLIES) ×1 IMPLANT
PAD CAST 4YDX4 CTTN HI CHSV (CAST SUPPLIES) ×1 IMPLANT
PADDING CAST COTTON 3X4 STRL (CAST SUPPLIES) ×1
PADDING CAST COTTON 4X4 STRL (CAST SUPPLIES) ×1
PEG LOCKING SMOOTH 2.2X18 (Peg) ×2 IMPLANT
PEG LOCKING SMOOTH 2.2X20 (Screw) ×6 IMPLANT
PEG LOCKING SMOOTH 2.2X22 (Screw) ×4 IMPLANT
PLATE NARROW DVR RIGHT (Plate) ×2 IMPLANT
PLATE STRAIGHT LOCK 1.5 (Plate) ×2 IMPLANT
SCREW 2.7X12MM (Screw) ×2 IMPLANT
SCREW 2.7X14MM (Screw) ×2 IMPLANT
SCREW BN 14X2.7XNONLOCK 3 LD (Screw) ×2 IMPLANT
SCREW L 1.5X14 (Screw) ×6 IMPLANT
SCREW LOCKING 1.5X10 (Screw) ×2 IMPLANT
SCREW LOCKING 1.5X8 (Screw) ×2 IMPLANT
SCREW LOCKING 2.7X15MM (Screw) ×4 IMPLANT
SCREW MULTI DIRECTIONAL 2.7X18 (Screw) ×2 IMPLANT
SCREW MULTI DIRECTIONAL 2.7X20 (Screw) ×2 IMPLANT
SCREW NONIOC 1.5 14M (Screw) ×4 IMPLANT
SCREW NONIOC 1.5 16M (Screw) IMPLANT
SCREW NONLOCK 2.7X18MM (Screw) ×2 IMPLANT
SOAP 2 % CHG 4 OZ (WOUND CARE) IMPLANT
SPLINT FIBERGLASS 3X35 (CAST SUPPLIES) ×2 IMPLANT
SPONGE GAUZE 4X4 12PLY (GAUZE/BANDAGES/DRESSINGS) ×2 IMPLANT
SUT PROLENE 4 0 PS 2 18 (SUTURE) ×6 IMPLANT
SUT VIC AB 2-0 CT1 27 (SUTURE) ×1
SUT VIC AB 2-0 CT1 TAPERPNT 27 (SUTURE) ×1 IMPLANT
SUT VIC AB 2-0 FS1 27 (SUTURE) ×2 IMPLANT
SUT VICRYL 4-0 PS2 18IN ABS (SUTURE) ×4 IMPLANT
SYR CONTROL 10ML LL (SYRINGE) IMPLANT
SYSTEM CHEST DRAIN TLS 7FR (DRAIN) IMPLANT
TOWEL OR 17X24 6PK STRL BLUE (TOWEL DISPOSABLE) ×4 IMPLANT
TOWEL OR 17X26 10 PK STRL BLUE (TOWEL DISPOSABLE) ×2 IMPLANT
TUBE CONNECTING 12X1/4 (SUCTIONS) ×2 IMPLANT
WATER STERILE IRR 1000ML POUR (IV SOLUTION) IMPLANT

## 2013-10-26 NOTE — ED Notes (Signed)
Dr. Opitz at bedside. 

## 2013-10-26 NOTE — ED Notes (Signed)
Portable xray at bedside.

## 2013-10-26 NOTE — Anesthesia Preprocedure Evaluation (Signed)
Anesthesia Evaluation  Patient identified by MRN, date of birth, ID band Patient awake    Reviewed: Allergy & Precautions, H&P , NPO status , Patient's Chart, lab work & pertinent test results  Airway Mallampati: II TM Distance: >3 FB Neck ROM: Limited    Dental   Pulmonary sleep apnea , COPDformer smoker,  + rhonchi         Cardiovascular hypertension, + CAD Rhythm:Regular Rate:Normal     Neuro/Psych Depression    GI/Hepatic   Endo/Other    Renal/GU      Musculoskeletal   Abdominal   Peds  Hematology   Anesthesia Other Findings Frail appearance  Reproductive/Obstetrics                           Anesthesia Physical Anesthesia Plan  ASA: IV  Anesthesia Plan: General   Post-op Pain Management:    Induction: Intravenous  Airway Management Planned: LMA  Additional Equipment:   Intra-op Plan:   Post-operative Plan: Extubation in OR  Informed Consent: I have reviewed the patients History and Physical, chart, labs and discussed the procedure including the risks, benefits and alternatives for the proposed anesthesia with the patient or authorized representative who has indicated his/her understanding and acceptance.     Plan Discussed with: CRNA and Surgeon  Anesthesia Plan Comments:         Anesthesia Quick Evaluation

## 2013-10-26 NOTE — Transfer of Care (Signed)
Immediate Anesthesia Transfer of Care Note  Patient: Judy Suarez  Procedure(s) Performed: Procedure(s): OPEN REDUCTION INTERNAL FIXATION (ORIF) WRIST FRACTURE (Right)  Patient Location: PACU  Anesthesia Type:General  Level of Consciousness: sedated and patient cooperative  Airway & Oxygen Therapy: Patient Spontanous Breathing and Patient connected to nasal cannula oxygen  Post-op Assessment: Report given to PACU RN and Post -op Vital signs reviewed and stable  Post vital signs: Reviewed and stable  Complications: No apparent anesthesia complications

## 2013-10-26 NOTE — Anesthesia Procedure Notes (Signed)
Procedure Name: LMA Insertion Performed by: Emrick Hensch Pre-anesthesia Checklist: Patient identified, Timeout performed, Suction available, Emergency Drugs available and Patient being monitored Patient Re-evaluated:Patient Re-evaluated prior to inductionOxygen Delivery Method: Circle system utilized Preoxygenation: Pre-oxygenation with 100% oxygen Intubation Type: IV induction Ventilation: Mask ventilation without difficulty LMA: LMA inserted LMA Size: 4.0 Number of attempts: 1 Placement Confirmation: positive ETCO2,  CO2 detector and breath sounds checked- equal and bilateral Tube secured with: Tape Dental Injury: Teeth and Oropharynx as per pre-operative assessment

## 2013-10-26 NOTE — Brief Op Note (Signed)
10/26/2013  6:12 PM  PATIENT:  Osborn Coho  79 y.o. female  PRE-OPERATIVE DIAGNOSIS:  right wrist open fracture  POST-OPERATIVE DIAGNOSIS:  right wrist open fracture  PROCEDURE:  Procedure(s): OPEN REDUCTION INTERNAL FIXATION (ORIF) WRIST FRACTURE (Right)  SURGEON:  Surgeon(s) and Role:    * Sharma Covert, MD - Primary  PHYSICIAN ASSISTANT: none  ASSISTANTS: none   ANESTHESIA:   general  EBL:  Total I/O In: 1000 [I.V.:1000] Out: 10 [Blood:10]  BLOOD ADMINISTERED:none  DRAINS: none   LOCAL MEDICATIONS USED:  NONE  SPECIMEN:  No Specimen  DISPOSITION OF SPECIMEN:  N/A  COUNTS:  YES  TOURNIQUET:   Total Tourniquet Time Documented: Upper Arm (Right) - 47 minutes Upper Arm (Right) - 47 minutes Total: Upper Arm (Right) - 94 minutes   DICTATION: .829562  PLAN OF CARE: Admit for overnight observation  PATIENT DISPOSITION:  PACU - hemodynamically stable.   Delay start of Pharmacological VTE agent (>24hrs) due to surgical blood loss or risk of bleeding: not applicable

## 2013-10-26 NOTE — ED Notes (Signed)
Dr. Dierdre Highman at bedside numbing pts wrist

## 2013-10-26 NOTE — H&P (Signed)
Judy Suarez is an 79 y.o. female.   Chief Complaint: right wrist pain HPI: PT FELL YESTERDAY AND SUSTAINED RIGHT WRIST FRACTURE REDUCED AND SPLINTED IN ED PT HERE FOR SURGERY TODAY ON RIGHT WRIST NO PRIOR INJURY TO RIGHT WRIST   Past Medical History  Diagnosis Date  . Hypertension   . Depression     No past surgical history on file.  No family history on file. Social History:  reports that she has never smoked. She does not have any smokeless tobacco history on file. She reports that she does not drink alcohol or use illicit drugs.  Allergies: No Known Allergies  Medications Prior to Admission  Medication Sig Dispense Refill  . aspirin 325 MG EC tablet Take 325 mg by mouth daily.      . Calcium Carb-Cholecalciferol (CALCIUM 600 + D PO) Take 1 tablet by mouth daily.      . Cyanocobalamin (B-12 PO) Take 1 tablet by mouth daily.      Marland Kitchen escitalopram (LEXAPRO) 10 MG tablet Take 10 mg by mouth daily.      Marland Kitchen HYDROcodone-acetaminophen (NORCO/VICODIN) 5-325 MG per tablet Take 1 tablet by mouth every 6 (six) hours as needed.  15 tablet  0  . metoprolol tartrate (LOPRESSOR) 25 MG tablet Take 25 mg by mouth 2 (two) times daily.      . risperiDONE (RISPERDAL) 1 MG tablet Take 1 mg by mouth at bedtime.      . cephALEXin (KEFLEX) 500 MG capsule Take 1 capsule (500 mg total) by mouth 4 (four) times daily.  28 capsule  0  . naproxen sodium (ANAPROX) 220 MG tablet Take 440 mg by mouth daily as needed (for pain).        Results for orders placed during the hospital encounter of 10/26/13 (from the past 48 hour(s))  BASIC METABOLIC PANEL     Status: Abnormal   Collection Time    10/26/13  2:09 PM      Result Value Range   Sodium 133 (*) 135 - 145 mEq/L   Potassium 4.3  3.5 - 5.1 mEq/L   Chloride 97  96 - 112 mEq/L   CO2 23  19 - 32 mEq/L   Glucose, Bld 109 (*) 70 - 99 mg/dL   BUN 26 (*) 6 - 23 mg/dL   Creatinine, Ser 1.61  0.50 - 1.10 mg/dL   Calcium 09.6 (*) 8.4 - 10.5 mg/dL   GFR calc  non Af Amer 54 (*) >90 mL/min   GFR calc Af Amer 62 (*) >90 mL/min   Comment: (NOTE)     The eGFR has been calculated using the CKD EPI equation.     This calculation has not been validated in all clinical situations.     eGFR's persistently <90 mL/min signify possible Chronic Kidney     Disease.   Dg Forearm Right  10/26/2013   CLINICAL DATA:  Status postreduction of distal right radius and ulna fractures.  EXAM: RIGHT FOREARM - 2 VIEW  COMPARISON:  Yesterday.  FINDINGS: Interval fiberglass cast. Previously described fractures of the distal radius and ulna. There is 1 shaft width of dorsal displacement of the distal fragments with 1.5 cm of overlapping of the fragments. There is also 1/3 to 1/2 shaft width of radial displacement of the distal fragments.  IMPRESSION: Significant residual displacement and overlapping of the fragments of the previously described distal radius and ulnar fractures following fiberglass cast placement.   Electronically Signed   By:  Gordan Payment M.D.   On: 10/26/2013 02:11   Dg Forearm Right  10/26/2013   CLINICAL DATA:  Status post fall; right wrist pain.  EXAM: RIGHT FOREARM - 2 VIEW  COMPARISON:  None  FINDINGS: Significantly displaced comminuted fractures involving the distal radius and ulna are again seen. There is rotation of the distal wrist, with more than 1 shaft width displacement of the distal radius.  No additional fractures are seen. The carpal rows articulate with the displaced radial fragment. The elbow joint is grossly unremarkable in appearance; no elbow joint effusion is identified. Soft tissue swelling is noted about the distal forearm.  IMPRESSION: Significantly displaced comminuted fractures involving the distal radius and ulna, with more than 1 shaft width displacement of the distal radius, and rotation of the distal wrist.   Electronically Signed   By: Roanna Raider M.D.   On: 10/26/2013 00:55    ROS NO RECENT ILLNESSES OR  HOSPITALIZATIONS  Blood  pressure 147/74, pulse 88, temperature 97.2 F (36.2 C), temperature source Oral, resp. rate 18, SpO2 98.00%. Physical Exam  General Appearance:  Alert, cooperative, no distress, appears stated age  Head:  Normocephalic, without obvious abnormality, atraumatic  Eyes:  Pupils equal, conjunctiva/corneas clear,         Throat: Lips, mucosa, and tongue normal; teeth and gums normal  Neck: No visible masses     Lungs:   respirations unlabored  Chest Wall:  No tenderness or deformity  Heart:  Regular rate and rhythm,  Abdomen:   Soft, non-tender,         Extremities: RIGHT WRIST: FINGERS WARM WELL PERFUSED MODERATE SWELLING TO FINGER TIPS LIMITED DIGITAL MOBILITY LIMITED ELBOW MOVEMENT  Pulses: 2+ and symmetric  Skin: Skin color, texture, turgor normal, no rashes or lesions     Neurologic: Normal    Assessment/Plan COMMINUTED RIGHT WRIST INTRAARTICULAR DISTAL RADIUS FRACTURE AND ULNA FRACTURE  RIGHT WRIST OPEN DEBRIDEMENT AND OPEN REDUCTION AND INTERNAL FIXATION, REPAIR AS INDICATED  R/B/A DISCUSSED WITH PT IN OFFICE.  PT VOICED UNDERSTANDING OF PLAN CONSENT SIGNED DAY OF SURGERY PT SEEN AND EXAMINED PRIOR TO OPERATIVE PROCEDURE/DAY OF SURGERY SITE MARKED. QUESTIONS ANSWERED WILL REMAIN AN INPATIENT FOLLOWING SURGERY  Sharma Covert 10/26/2013, 3:13 PM

## 2013-10-26 NOTE — Preoperative (Signed)
Beta Blockers   Reason not to administer Beta Blockers:Not Applicable 

## 2013-10-26 NOTE — ED Notes (Signed)
Ortho tech paged  

## 2013-10-26 NOTE — Progress Notes (Signed)
Orthopedic Tech Progress Note Patient Details:  Judy Suarez [birth year] [id number]  Ortho Devices Type of Ortho Device: Volar splint;Arm sling  Volar With side rail Haskell Flirt 10/26/2013, 1:33 AM

## 2013-10-27 ENCOUNTER — Encounter (HOSPITAL_COMMUNITY): Payer: Self-pay | Admitting: General Practice

## 2013-10-27 MED ORDER — PNEUMOCOCCAL VAC POLYVALENT 25 MCG/0.5ML IJ INJ
0.5000 mL | INJECTION | INTRAMUSCULAR | Status: AC
  Administered 2013-10-27: 0.5 mL via INTRAMUSCULAR
  Filled 2013-10-27: qty 0.5

## 2013-10-27 NOTE — Op Note (Signed)
Judy Suarez, SLOMA NO.:  [id number]  MEDICAL RECORD NO.:  [id number]  LOCATION:  5N29C                        FACILITY:  [facility]  PHYSICIAN:  Madelynn Done, MD  DATE OF BIRTH:  1931/08/04  DATE OF PROCEDURE:  10/26/2013 DATE OF DISCHARGE:                              OPERATIVE REPORT   PREOPERATIVE DIAGNOSES: 1. Right wrist intra-articular distal radius fracture with 3 more     fragments. 2. Right wrist distal ulna fracture, grade 2 open fracture.  POSTOPERATIVE DIAGNOSES: 1. Right wrist intra-articular distal radius fracture with 3 more     fragments. 2. Right wrist distal ulna fracture, grade 2 open fracture.  ATTENDING PHYSICIAN:  Madelynn Done, MD who was scrubbed and present for the entire procedure.  ASSISTANT SURGEON:  None.  ANESTHESIA:  General via LMA.  PROCEDURE: 1. Open treatment of right wrist intra-articular distal radius     fracture 3 more fragments. 2. Right wrist debridement of skin, subcutaneous tissue, and bone     associated with open fracture, excisional debridement right ulna. 3. Right ulna open reduction and internal fixation of the ulnar shaft     and head. 4. Radiographs 3 views, right wrist.  SURGICAL INDICATIONS:  Ms. Bralley is a right-hand-dominant female who fell last night sustaining an injury to her right wrist.  The patient was sent to my office for evaluation and treatment.  Based on the degree of the injury, it was recommended that she undergo the above procedure. Risks, benefits, and alternatives were discussed in detail with the patient and signed informed consent was obtained.  Risks include, but not limited to bleeding, infection, damage to nearby nerves, arteries, or tendons, loss of motion of wrist and digits, nonunion, malunion, hardware failure, and need for further surgical intervention.  DESCRIPTION OF PROCEDURE:  The patient was appropriately identified in the preop holding area and marked with  a permanent marker on the right wrist to indicate the correct operative site.  The patient was then brought back to the operating room and placed supine on the anesthesia table.  General anesthesia was administered.  The patient tolerated this well.  A well-padded tourniquet was placed on the right brachium and sealed with 1000 drape.  Right upper extremity was then prepped and draped in normal sterile fashion.  Time-out was called.  Correct site was identified, and procedure then begun.  Attention was then turned to the right wrist where a long incision was made directly over the FCR sheath.  Limb was elevated and tourniquet insufflated.  Dissection was carried down through the skin and subcutaneous tissue.  The FCR sheath was then opened and going through the floor of this the fracture site was then exposed.  The patient did have a highly comminuted intra- articular fracture 3 or more fragments.  What remained of the pronator quadratus was then carefully elevated and saved for later repair.  The brachioradialis was then carefully released and tenotomy was then carried out of the brachioradialis releasing this off the radial styloid to aid in reduction.  An open reduction was then performed and this reduced very nicely the intra-articular fracture.  The  volar plate was then applied in the bone and a Biomet DVR cross locking plate was then used.  A combination of locking pegs and screws were then used distally after the plate height was adjusted in the oblong screw hole.  Further shaft screws were then placed with a good purchase of all screws. Radiographs were then obtained which did show good alignment and good reduction without articular penetration of the internal fixation.  After internal fixation of the radius, the wound was then thoroughly irrigated.  The pronator quadratus was closed with 2-0 Vicryl suture. Subcutaneous tissue was closed with 4-0 Vicryl and the skin closed  with simple 4-0 Prolene.  Attention was then turned to the open ulna fracture.  The 2 cm wound was then lengthened both proximally and distally. Dissection was carried all the way down to the fracture site. Fracture site was then opened up and excisional debridement was then carried out in the skin, subcutaneous tissue and of bone.  This was done with small curettes, rongeurs and scalpels.  An open reduction was then performed in the distal ulna.  Given the distal ulna and the size of the distal segment, internal fixation was then chosen with a 1.5 mm locking plate from the handset.  A total of 6 screws were then used, all locking screws after the appropriate drilling and depth gauge measurement. After fixation of the ulnar shaft, the wound was then irrigated.  The fascia layer was then closed with 2-0 Vicryl, subcutaneous tissues closed with 4-0 Vicryl and the skin closed with 4-0 Prolene.  Xeroform dressings were then applied.  Tourniquet had been deflated.  Good perfusion of the fingers.  Sterile compressive bandage was then applied. The patient was then placed in a well-padded sugar-tong splint, extubated, and taken to recovery room in good condition.  POSTPROCEDURE PLAN:  The patient was admitted overnight for IV antibiotics and pain control, discharged in the morning, seen back in the office in approximately 2 weeks for wound check, suture removal, and x-rays, long-arm cast immobilization for 4 weeks, and then short-arm cast for 2 weeks, and then begin an outpatient therapy regimen. Radiographs at each visit.     Madelynn Done, MD     FWO/MEDQ  D:  10/26/2013  T:  10/27/2013  Job:  [id number]

## 2013-10-27 NOTE — Anesthesia Postprocedure Evaluation (Signed)
  Anesthesia Post-op Note  Patient: Judy Suarez  Procedure(s) Performed: Procedure(s): OPEN REDUCTION INTERNAL FIXATION (ORIF) WRIST FRACTURE (Right)  Patient Location: PACU  Anesthesia Type:General  Level of Consciousness: awake  Airway and Oxygen Therapy: Patient Spontanous Breathing  Post-op Pain: mild  Post-op Assessment: Post-op Vital signs reviewed, Patient's Cardiovascular Status Stable, Respiratory Function Stable, Patent Airway, No signs of Nausea or vomiting and Pain level controlled  Post-op Vital Signs: Reviewed and stable  Complications: No apparent anesthesia complications

## 2013-10-27 NOTE — Evaluation (Addendum)
Physical Therapy Evaluation Patient Details Name: HAFSAH HENDLER MRN: [id number] DOB: May 20, 1931 Today's Date: 10/27/2013 Time: 4098-1191 PT Time Calculation (min): 38 min  PT Assessment / Plan / Recommendation History of Present Illness  79 y/o female who fell at home with R distal radius and ulna fx s/p ORIF  Clinical Impression  Pt received lying in bed this morning, noted to be lethargic, but stating she wanted to get up to use restroom.  She wanted to attempt ambulating to restroom, however once in standing, she was very delayed in taking step and initially required mod assist to steady.  Therefore brought 3in1 to her bedside for mod assist stand pivot.  She was then able to ambulate with min assist.  PT/OT educated on sling placement for ambulation and having min assist at home until pts cognition improves.  RN aware of cognition and both him and niece feel that it is due to medication.  PT recommends HHPT initially until can transition safely back to OP.      PT Assessment  Patient needs continued PT services    Follow Up Recommendations  Home health PT;Supervision for mobility/OOB    Does the patient have the potential to tolerate intense rehabilitation      Barriers to Discharge        Equipment Recommendations  None recommended by PT    Recommendations for Other Services     Frequency Min 4X/week    Precautions / Restrictions Precautions Precautions: Fall Precaution Comments: Pt somewhat confused and demonstrates slow processing due to medications Restrictions Weight Bearing Restrictions: Yes RUE Weight Bearing: Non weight bearing   Pertinent Vitals/Pain No pain      Mobility  Bed Mobility Bed Mobility: Supine to Sit Supine to Sit: HOB flat;4: Min assist Details for Bed Mobility Assistance: Pt able to elevate trunk and lower BLEs out of bed, however requires assist for scooting completely to EOB prior to standing. cues for technique, however note pt with  decreased willingness to use LUE due to slight pain from IV.  Transfers Transfers: Sit to Stand;Stand to [employer] Transfers Sit to Stand: 4: Min assist;From bed Stand to Sit: 4: Min assist;To chair/3-in-1 Stand Pivot Transfers: 3: Mod assist;With armrests Details for Transfer Assistance: Pt able to stand with min assist to faciliate forward weight shift and also assist at hips and upper trunk to facilitate upright posture.  Performed standing several times and also performed stand pivot transfer at mod assist to 3in1 by bedside.  Note she was confused when attempting to perform peri care and wiped lower leg instead.  Mod cues for correction, however pt continued to demonstrate increased forward lean.  Assisted with peri care and back to bed.   Ambulation/Gait Ambulation/Gait Assistance: 4: Min assist Ambulation Distance (Feet): 50 Feet Assistive device: 1 person hand held assist Ambulation/Gait Assistance Details: Iniitally provided +2 assist for safety, however pt then able to ambulate with only LUE supported and hand around waist to steady for safety.  Provided cues for upright posture and increased step length and width throughout.  Educated pts neice on how to assist pt at home and to don sling when ambulating.  Gait Pattern: Step-through pattern;Decreased stride length;Trunk flexed;Narrow base of support Gait velocity: decreased Stairs: No Wheelchair Mobility Wheelchair Mobility: No    Exercises     PT Diagnosis: Difficulty walking;Generalized weakness;Acute pain  PT Problem List: Decreased strength;Decreased activity tolerance;Decreased balance;Decreased mobility;Decreased coordination;Decreased cognition;Decreased safety awareness;Decreased knowledge of precautions;Pain PT Treatment Interventions: Gait training;Functional  mobility training;Therapeutic activities;Therapeutic exercise;Balance training;Patient/family education     PT Goals(Current goals can be found in the care  plan section) Acute Rehab PT Goals Patient Stated Goal: to return home, per neice PT Goal Formulation: With patient/family Time For Goal Achievement: 10/30/13 Potential to Achieve Goals: Good  Visit Information  Last PT Received On: 10/27/13 Assistance Needed: +1 History of Present Illness: 79 y/o female who fell at home with R distal radius and ulna fx s/p ORIF       Prior Functioning  Home Living Family/patient expects to be discharged to:: Private residence Living Arrangements: Other relatives Available Help at Discharge: Family;Available 24 hours/day Type of Home: House Home Access: Level entry Home Layout: Two level;Able to live on main level with bedroom/bathroom Home Equipment: Gilmer Mor - single point;Walker - 4 wheels (rollator) Additional Comments: niece helps her get into shower (has shower seat in tub/shower), regular height commode Prior Function Level of Independence: Needs assistance ADL's / Homemaking Assistance Needed: Assist for getting into tub    Cognition  Cognition Arousal/Alertness: Suspect due to medications Behavior During Therapy: WFL for tasks assessed/performed Overall Cognitive Status: Impaired/Different from baseline Area of Impairment: Following commands Following Commands: Follows one step commands consistently    Extremity/Trunk Assessment Lower Extremity Assessment Lower Extremity Assessment: Generalized weakness Cervical / Trunk Assessment Cervical / Trunk Assessment: Kyphotic   Balance    End of Session PT - End of Session Equipment Utilized During Treatment: Gait belt Activity Tolerance: Patient limited by fatigue Patient left: in chair;with call bell/phone within reach;with family/visitor present Nurse Communication: Mobility status (notified of confusion)  GP Functional Assessment Tool Used: Clinical judgement Functional Limitation: Mobility: Walking and moving around Mobility: Walking and Moving Around Current Status ([id number]): At least  1 percent but less than 20 percent impaired, limited or restricted Mobility: Walking and Moving Around Goal Status [phone]): At least 1 percent but less than 20 percent impaired, limited or restricted Mobility: Walking and Moving Around Discharge Status [phone]): At least 1 percent but less than 20 percent impaired, limited or restricted   Vista Deck 10/27/2013, 9:36 AM

## 2013-10-27 NOTE — Discharge Summary (Signed)
Physician Discharge Summary  Patient ID: Judy Suarez MRN: [id number] DOB/AGE: February 04, 1931 79 y.o.  Admit date: 10/26/2013 Discharge date: 10/27/2013  Admission Diagnoses: right wrist open fracture Past Medical History  Diagnosis Date  . Hypertension   . Depression   . Coronary artery disease   . Arthritis     Discharge Diagnoses:  Eft distal radius fracture  Surgeries: Procedure(s): OPEN REDUCTION INTERNAL FIXATION (ORIF) WRIST FRACTURE on 10/26/2013    Consultants:  none  Discharged Condition: Improved  Hospital Course: Judy Suarez is an 79 y.o. female who was admitted 10/26/2013 with a chief complaint of No chief complaint on file. , and found to have a diagnosis of right wrist open fracture.  They were brought to the operating room on 10/26/2013 and underwent Procedure(s): OPEN REDUCTION INTERNAL FIXATION (ORIF) WRIST FRACTURE.    They were given perioperative antibiotics: Anti-infectives   Start     Dose/Rate Route Frequency Ordered Stop   10/27/13 0230  ceFAZolin (ANCEF) IVPB 1 g/50 mL premix     1 g 100 mL/hr over 30 Minutes Intravenous Every 8 hours 10/26/13 2020     10/26/13 2030  ceFAZolin (ANCEF) IVPB 1 g/50 mL premix     1 g 100 mL/hr over 30 Minutes Intravenous NOW 10/26/13 2020 10/26/13 2132   10/26/13 1512  ceFAZolin (ANCEF) 2-3 GM-% IVPB SOLR    Comments:  Lenox Ahr   : cabinet override      10/26/13 1512 10/26/13 1539    .  They were given sequential compression devices, early ambulation, and Other (comment)ambulation for DVT prophylaxis.  Recent vital signs: Patient Vitals for the past 24 hrs:  BP Temp Pulse Resp SpO2  10/27/13 1028 135/59 mmHg - 83 - -  10/26/13 1945 137/63 mmHg - - 13 -  10/26/13 1939 146/97 mmHg 97.6 F (36.4 C) 82 22 98 %  10/26/13 1930 154/60 mmHg - 84 22 98 %  10/26/13 1915 152/61 mmHg - 72 13 99 %  10/26/13 1900 - - 77 13 98 %  10/26/13 1845 148/69 mmHg - 78 14 98 %  10/26/13 1830 - - 78 13 97 %  10/26/13 1824  159/70 mmHg 98.2 F (36.8 C) 79 14 99 %  .  Recent laboratory studies: Dg Chest 2 View  10/26/2013   CLINICAL DATA:  Preop chest.  Hypertension.  EXAM: CHEST  2 VIEW  COMPARISON:  04/20/2013.  FINDINGS: Mediastinum and hilar structures are normal. Heart size normal. Thoracic aorta tortuous. Platelike atelectasis versus scarring lung bases. Lungs are clear of infiltrates. No pneumothorax. Degenerative changes noted both shoulders and thoracic spine. Diffuse osteopenia is present.  IMPRESSION: Platelike atelectasis and/or scarring both lung bases. No acute cardiopulmonary disease otherwise noted.   Electronically Signed   By: Maisie Fus  Register   On: 10/26/2013 15:16   Dg Forearm Right  10/26/2013   CLINICAL DATA:  Status postreduction of distal right radius and ulna fractures.  EXAM: RIGHT FOREARM - 2 VIEW  COMPARISON:  Yesterday.  FINDINGS: Interval fiberglass cast. Previously described fractures of the distal radius and ulna. There is 1 shaft width of dorsal displacement of the distal fragments with 1.5 cm of overlapping of the fragments. There is also 1/3 to 1/2 shaft width of radial displacement of the distal fragments.  IMPRESSION: Significant residual displacement and overlapping of the fragments of the previously described distal radius and ulnar fractures following fiberglass cast placement.   Electronically Signed   By: Ardeth Perfect.D.  On: 10/26/2013 02:11   Dg Forearm Right  10/26/2013   CLINICAL DATA:  Status post fall; right wrist pain.  EXAM: RIGHT FOREARM - 2 VIEW  COMPARISON:  None  FINDINGS: Significantly displaced comminuted fractures involving the distal radius and ulna are again seen. There is rotation of the distal wrist, with more than 1 shaft width displacement of the distal radius.  No additional fractures are seen. The carpal rows articulate with the displaced radial fragment. The elbow joint is grossly unremarkable in appearance; no elbow joint effusion is identified. Soft  tissue swelling is noted about the distal forearm.  IMPRESSION: Significantly displaced comminuted fractures involving the distal radius and ulna, with more than 1 shaft width displacement of the distal radius, and rotation of the distal wrist.   Electronically Signed   By: Roanna Raider M.D.   On: 10/26/2013 00:55    Discharge Medications:     Medication List         aspirin 325 MG EC tablet  Take 325 mg by mouth daily.     B-12 PO  Take 1 tablet by mouth daily.     CALCIUM 600 + D PO  Take 1 tablet by mouth daily.     cephALEXin 500 MG capsule  Commonly known as:  KEFLEX  Take 1 capsule (500 mg total) by mouth 4 (four) times daily.     docusate sodium 100 MG capsule  Commonly known as:  COLACE  Take 1 capsule (100 mg total) by mouth 2 (two) times daily.     escitalopram 10 MG tablet  Commonly known as:  LEXAPRO  Take 10 mg by mouth daily.     HYDROcodone-acetaminophen 5-325 MG per tablet  Commonly known as:  NORCO/VICODIN  Take 1 tablet by mouth every 6 (six) hours as needed.     metoprolol tartrate 25 MG tablet  Commonly known as:  LOPRESSOR  Take 25 mg by mouth 2 (two) times daily.     naproxen sodium 220 MG tablet  Commonly known as:  ANAPROX  Take 440 mg by mouth daily as needed (for pain).     oxyCODONE-acetaminophen 5-325 MG per tablet  Commonly known as:  ROXICET  Take 1 tablet by mouth every 4 (four) hours as needed for moderate pain or severe pain.     risperiDONE 1 MG tablet  Commonly known as:  RISPERDAL  Take 1 mg by mouth at bedtime.     vitamin C 500 MG tablet  Commonly known as:  ASCORBIC ACID  Take 1 tablet (500 mg total) by mouth daily.        Diagnostic Studies: Dg Chest 2 View  10/26/2013   CLINICAL DATA:  Preop chest.  Hypertension.  EXAM: CHEST  2 VIEW  COMPARISON:  04/20/2013.  FINDINGS: Mediastinum and hilar structures are normal. Heart size normal. Thoracic aorta tortuous. Platelike atelectasis versus scarring lung bases. Lungs are  clear of infiltrates. No pneumothorax. Degenerative changes noted both shoulders and thoracic spine. Diffuse osteopenia is present.  IMPRESSION: Platelike atelectasis and/or scarring both lung bases. No acute cardiopulmonary disease otherwise noted.   Electronically Signed   By: Maisie Fus  Register   On: 10/26/2013 15:16   Dg Forearm Right  10/26/2013   CLINICAL DATA:  Status postreduction of distal right radius and ulna fractures.  EXAM: RIGHT FOREARM - 2 VIEW  COMPARISON:  Yesterday.  FINDINGS: Interval fiberglass cast. Previously described fractures of the distal radius and ulna. There is 1 shaft width of  dorsal displacement of the distal fragments with 1.5 cm of overlapping of the fragments. There is also 1/3 to 1/2 shaft width of radial displacement of the distal fragments.  IMPRESSION: Significant residual displacement and overlapping of the fragments of the previously described distal radius and ulnar fractures following fiberglass cast placement.   Electronically Signed   By: Gordan Payment M.D.   On: 10/26/2013 02:11   Dg Forearm Right  10/26/2013   CLINICAL DATA:  Status post fall; right wrist pain.  EXAM: RIGHT FOREARM - 2 VIEW  COMPARISON:  None  FINDINGS: Significantly displaced comminuted fractures involving the distal radius and ulna are again seen. There is rotation of the distal wrist, with more than 1 shaft width displacement of the distal radius.  No additional fractures are seen. The carpal rows articulate with the displaced radial fragment. The elbow joint is grossly unremarkable in appearance; no elbow joint effusion is identified. Soft tissue swelling is noted about the distal forearm.  IMPRESSION: Significantly displaced comminuted fractures involving the distal radius and ulna, with more than 1 shaft width displacement of the distal radius, and rotation of the distal wrist.   Electronically Signed   By: Roanna Raider M.D.   On: 10/26/2013 00:55    They benefited maximally from their  hospital stay and there were no complications.     Disposition: 01-Home or Self Care     Discharge Orders   Future Appointments Provider Department Dept Phone   10/31/2013 11:20 AM Laqueta Linden, MD [facility] [phone]   Future Orders Complete By Expires   Discharge patient  As directed      Follow-up Information   Follow up with Sharma Covert, MD In 14 days.   Specialty:  Orthopedic Surgery   Contact information:   [address] [place] [address] [phone]        Signed: Sharma Covert 10/27/2013, 3:18 PM

## 2013-10-27 NOTE — Progress Notes (Signed)
10/27/13 PT eval recommended HHPT. Spoke with patient and her daughter who she lives with and is her main care giver.Patient is currently receiving outpatient PT at [facility] in [place] and they would like to continue with outpatient services rather than having therapy in the home. Contacted PT to discuss outpatient instead of HH, spoke with PT director Vonna Kotyk, outpatient would be fine as long as family able to transport patient safely. Daughter states that patient does much better when able to get out of the house for therapy. [facility] office, spoke with Herbert Seta, she requested outpatient PT order be faxed. Faxed order.They will continue with patient's PT. Updated patient and daughter. Jacquelynn Cree RN, BSN, CCM

## 2013-10-27 NOTE — Evaluation (Signed)
Occupational Therapy Evaluation and Discharge Patient Details Name: Judy Suarez MRN: [id number] DOB: [birth year] Today's Date: 10/27/2013 Time: 4098-1191 OT Time Calculation (min): 21 min  OT Assessment / Plan / Recommendation History of present illness 79 y/o female who fell at home with R distal radius and ulna fx s/p ORIF   Clinical Impression   This 79 yo female admitted and underwent above presents to acute OT with decreased AROM for RUE; NWB'ing RUE, confusion/lethargy believed to be due to meds per niece (RN aware)--all affecting pt's PLOF. All education completed and pt will have niece to A her as much as needed at home and niece is aware pt will need more A than she did before this. Acute OT will sign off.    OT Assessment   (follow up therapy per MD for arm)    Follow Up Recommendations  No OT follow up       Equipment Recommendations  None recommended by OT          Precautions / Restrictions Precautions Precautions: Fall Shoulder Interventions: Shoulder sling/immobilizer (wear when up and about) Precaution Comments: Pt somewhat confused and demonstrates slow processing due to medications Required Braces or Orthoses: Sling Restrictions Weight Bearing Restrictions: Yes RUE Weight Bearing: Non weight bearing       ADL  Equipment Used: Gait belt (HHA) Transfers/Ambulation Related to ADLs: Min A without AD ADL Comments: total A at this time due to RUE fxs and now s/p surgery with limited use of it and NWB'ing     Acute Rehab OT Goals Patient Stated Goal: to return home, per neice  Visit Information  Last OT Received On: 10/27/13 Assistance Needed: +1 PT/OT Co-Evaluation/Treatment: Yes (partial) History of Present Illness: 79 y/o female who fell at home with R distal radius and ulna fx s/p ORIF       Prior Functioning     Home Living Family/patient expects to be discharged to:: Private residence Living Arrangements: Other relatives Available Help at  Discharge: Family;Available 24 hours/day Type of Home: House Home Access: Level entry Home Layout: Two level;Able to live on main level with bedroom/bathroom Home Equipment: Gilmer Mor - single point;Walker - 4 wheels (rollator) Additional Comments: niece helps her get into shower (has shower seat in tub/shower), regular height commode Prior Function Level of Independence: Needs assistance ADL's / Homemaking Assistance Needed: Assist for getting into tub Communication Communication:  (slow to respond at times) Dominant Hand: Right         Vision/Perception Vision - History Patient Visual Report: No change from baseline   Cognition  Cognition Arousal/Alertness: Lethargic;Suspect due to medications Behavior During Therapy: [facility] for tasks assessed/performed Overall Cognitive Status: Impaired/Different from baseline Area of Impairment: Following commands Following Commands: Follows one step commands consistently (with increased time at times)    Extremity/Trunk Assessment Upper Extremity Assessment Upper Extremity Assessment: RUE deficits/detail RUE Deficits / Details: splint from [facility] to above elbow; edema at fingers; shoulder WNL AAROM but arm heavy due to splinting/casting RUE Coordination: decreased gross motor;decreased fine motor Lower Extremity Assessment Lower Extremity Assessment: Generalized weakness Cervical / Trunk Assessment Cervical / Trunk Assessment: Kyphotic     Mobility Bed Mobility Bed Mobility: Supine to Sit Supine to Sit: HOB flat;4: Min assist Details for Bed Mobility Assistance: Pt able to elevate trunk and lower BLEs out of bed, however requires assist for scooting completely to EOB prior to standing. cues for technique, however note pt with decreased willingness to use LUE due to slight pain  from IV.  Transfers Sit to Stand: 4: Min assist;From bed Stand to Sit: 4: Min assist;To chair/3-in-1 Details for Transfer Assistance: Pt able to stand with min assist  to faciliate forward weight shift and also assist at hips and upper trunk to facilitate upright posture.  Performed standing several times and also performed stand pivot transfer at mod assist to 3in1 by bedside.  Note she was confused when attempting to perform peri care and wiped lower leg instead.  Mod cues for correction, however pt continued to demonstrate increased forward lean.  Assisted with peri care and back to bed.       Exercise Other Exercises Other Exercises: Instructed niece on AAROM shoulder ROM and digt ROM      End of Session OT - End of Session Equipment Utilized During Treatment: Gait belt Activity Tolerance: Patient tolerated treatment well Patient left: in chair;with family/visitor present  GO Functional Assessment Tool Used: Clinical Observation Functional Limitation: Self care Self Care Current Status (Z6109): At least 80 percent but less than 100 percent impaired, limited or restricted Self Care Goal Status (U0454): At least 80 percent but less than 100 percent impaired, limited or restricted Self Care Discharge Status [phone]): At least 80 percent but less than 100 percent impaired, limited or restricted   Evette Georges 10/27/2013, 11:36 AM

## 2013-10-30 ENCOUNTER — Encounter (HOSPITAL_COMMUNITY): Payer: Self-pay | Admitting: Orthopedic Surgery

## 2013-10-31 ENCOUNTER — Encounter: Payer: Self-pay | Admitting: Cardiovascular Disease

## 2013-10-31 ENCOUNTER — Ambulatory Visit (INDEPENDENT_AMBULATORY_CARE_PROVIDER_SITE_OTHER): Payer: Medicare Other | Admitting: Cardiovascular Disease

## 2013-10-31 VITALS — BP 111/70 | HR 69 | Ht 60.0 in | Wt 115.0 lb

## 2013-10-31 DIAGNOSIS — Z9181 History of falling: Secondary | ICD-10-CM

## 2013-10-31 DIAGNOSIS — E785 Hyperlipidemia, unspecified: Secondary | ICD-10-CM

## 2013-10-31 DIAGNOSIS — I10 Essential (primary) hypertension: Secondary | ICD-10-CM

## 2013-10-31 DIAGNOSIS — I2510 Atherosclerotic heart disease of native coronary artery without angina pectoris: Secondary | ICD-10-CM

## 2013-10-31 MED ORDER — ASPIRIN EC 81 MG PO TBEC: 81.0000 mg | DELAYED_RELEASE_TABLET | Freq: Every day | ORAL | Status: AC

## 2013-10-31 NOTE — Patient Instructions (Signed)
   Decrease Aspirin to 81mg daily  Continue all other medications.   Your physician wants you to follow up in:  1 year.  You will receive a reminder letter in the mail one-two months in advance.  If you don't receive a letter, please call our office to schedule the follow up appointment   

## 2013-10-31 NOTE — Progress Notes (Signed)
Patient ID: Judy Suarez, female   DOB: 08/19/1931, 79 y.o.   MRN: [id number]      SUBJECTIVE: The patient is an 79 year old female with a history of ischemic heart disease, normal ejection fraction, and multiple cardiac risk factors. The patient had a stress test done in January of 2011 which was within normal limits with an ejection fraction of 71%. The patient has a history of severe single-vessel coronary artery disease status post drug-eluting stent placement to the right coronary artery in 2009. She also has hypertension, dyslipidemia, and remote tobacco abuse.  She recently fell at home with R distal radius and ulna fx s/p ORIF. Her daughter says her fall was due to her manic-depression. She says that when her mother gets angry, she starts moving too quickly.  Prior to her stent, her symptoms were that of chest pain. At present, she denies chest pain, palpitations, dizziness, syncope, and shortness of breath.      No Known Allergies  Current Outpatient Prescriptions  Medication Sig Dispense Refill  . aspirin 325 MG EC tablet Take 325 mg by mouth daily.      . Calcium Carb-Cholecalciferol (CALCIUM 600 + D PO) Take 1 tablet by mouth daily.      . clonazePAM (KLONOPIN) 0.5 MG tablet Take 1 tablet by mouth 2 (two) times daily as needed.      . Cyanocobalamin (B-12 PO) Take 1 tablet by mouth daily.      Marland Kitchen docusate sodium (COLACE) 100 MG capsule Take 1 capsule (100 mg total) by mouth 2 (two) times daily.  10 capsule  0  . escitalopram (LEXAPRO) 10 MG tablet Take 10 mg by mouth daily.      Marland Kitchen ibuprofen (ADVIL,MOTRIN) 200 MG tablet Take 200 mg by mouth every 6 (six) hours as needed.      . metoprolol tartrate (LOPRESSOR) 25 MG tablet Take 25 mg by mouth 2 (two) times daily.      . Multiple Vitamin (MULTIVITAMIN) tablet Take 1 tablet by mouth daily.      . risperiDONE (RISPERDAL) 1 MG tablet Take 1 mg by mouth at bedtime.      . vitamin C (ASCORBIC ACID) 500 MG tablet Take 1 tablet (500 mg  total) by mouth daily.  50 tablet  0   No current facility-administered medications for this visit.    Past Medical History  Diagnosis Date  . Hypertension   . Depression   . Coronary artery disease   . Arthritis     Past Surgical History  Procedure Laterality Date  . Orif wrist fracture Right 10/26/2013  . Coronary angioplasty      PER FAMILY  . Orif wrist fracture Right 10/26/2013    Procedure: OPEN REDUCTION INTERNAL FIXATION (ORIF) WRIST FRACTURE;  Surgeon: Sharma Covert, MD;  Location: [facility] OR;  Service: Orthopedics;  Laterality: Right;    History   Social History  . Marital Status: Widowed    Spouse Name: N/A    Number of Children: N/A  . Years of Education: N/A   Occupational History  . Not on file.   Social History Main Topics  . Smoking status: Never Smoker   . Smokeless tobacco: Never Used  . Alcohol Use: No  . Drug Use: No  . Sexual Activity: Not on file   Other Topics Concern  . Not on file   Social History Narrative  . No narrative on file     Filed Vitals:   10/31/13 1105  BP: 111/70  Pulse: 69  Height: 5' (1.524 m)  Weight: 115 lb (52.164 kg)    PHYSICAL EXAM General: NAD Neck: No JVD, no thyromegaly or thyroid nodule.  Lungs: Clear to auscultation bilaterally with normal respiratory effort. CV: Nondisplaced PMI.  Heart regular S1/S2, no S3/S4, no murmur.  No peripheral edema.  No carotid bruit.  Normal pedal pulses.  Abdomen: Soft, nontender, no hepatosplenomegaly, no distention.  Neurologic: Alert and oriented x 3.  Psych: Normal affect. Extremities: No clubbing or cyanosis. Cast on right forearm.  ECG: reviewed and available in electronic records.      ASSESSMENT AND PLAN: 1. CAD: symptomatically stable. Reduce ASA to 81 mg daily. Continue metoprolol at current dose. 2. HTN: well controlled on present therapy. 3. Hyperlipidemia: pt is reportedly intolerant to statin therapy. Will manage with diet.   Prentice Docker,  M.D., F.A.C.C.

## 2013-11-01 ENCOUNTER — Ambulatory Visit: Payer: Medicare Other | Admitting: Physical Therapy

## 2013-11-03 ENCOUNTER — Ambulatory Visit: Payer: Medicare Other | Admitting: Physical Therapy

## 2013-11-06 ENCOUNTER — Ambulatory Visit: Payer: Medicare Other | Admitting: Physical Therapy

## 2013-11-08 ENCOUNTER — Ambulatory Visit: Payer: Medicare Other | Admitting: Physical Therapy

## 2013-11-14 ENCOUNTER — Ambulatory Visit: Payer: Medicare Other | Attending: *Deleted | Admitting: Physical Therapy

## 2013-11-14 DIAGNOSIS — IMO0001 Reserved for inherently not codable concepts without codable children: Secondary | ICD-10-CM | POA: Insufficient documentation

## 2013-11-14 DIAGNOSIS — Z9181 History of falling: Secondary | ICD-10-CM | POA: Insufficient documentation

## 2013-11-14 DIAGNOSIS — R269 Unspecified abnormalities of gait and mobility: Secondary | ICD-10-CM | POA: Insufficient documentation

## 2013-11-14 DIAGNOSIS — R5381 Other malaise: Secondary | ICD-10-CM | POA: Insufficient documentation

## 2013-11-14 DIAGNOSIS — M6281 Muscle weakness (generalized): Secondary | ICD-10-CM | POA: Insufficient documentation

## 2013-11-15 ENCOUNTER — Ambulatory Visit: Payer: Medicare Other | Admitting: Physical Therapy

## 2013-11-20 ENCOUNTER — Ambulatory Visit: Payer: Medicare Other | Admitting: Physical Therapy

## 2013-11-22 ENCOUNTER — Ambulatory Visit: Payer: Medicare Other | Admitting: Physical Therapy

## 2014-03-07 ENCOUNTER — Ambulatory Visit (INDEPENDENT_AMBULATORY_CARE_PROVIDER_SITE_OTHER): Payer: Commercial Managed Care - HMO | Admitting: Psychiatry

## 2014-03-07 ENCOUNTER — Encounter (HOSPITAL_COMMUNITY): Payer: Self-pay | Admitting: Psychiatry

## 2014-03-07 VITALS — BP 98/60 | Ht 60.0 in | Wt 126.0 lb

## 2014-03-07 DIAGNOSIS — F333 Major depressive disorder, recurrent, severe with psychotic symptoms: Secondary | ICD-10-CM

## 2014-03-07 DIAGNOSIS — F323 Major depressive disorder, single episode, severe with psychotic features: Secondary | ICD-10-CM

## 2014-03-07 MED ORDER — RISPERIDONE 1 MG PO TABS: 1.0000 mg | ORAL_TABLET | Freq: Every day | ORAL | Status: DC

## 2014-03-07 MED ORDER — CITALOPRAM HYDROBROMIDE 20 MG PO TABS: 20.0000 mg | ORAL_TABLET | Freq: Every day | ORAL | Status: DC
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
## 2014-04-04 ENCOUNTER — Ambulatory Visit (HOSPITAL_COMMUNITY): Payer: Self-pay | Admitting: Psychiatry

## 2014-04-05 ENCOUNTER — Ambulatory Visit (INDEPENDENT_AMBULATORY_CARE_PROVIDER_SITE_OTHER): Payer: Medicare HMO | Admitting: Psychiatry

## 2014-04-05 ENCOUNTER — Encounter (HOSPITAL_COMMUNITY): Payer: Self-pay | Admitting: Psychiatry

## 2014-04-05 VITALS — BP 90/62 | Ht 60.0 in | Wt 126.0 lb

## 2014-04-05 DIAGNOSIS — F333 Major depressive disorder, recurrent, severe with psychotic symptoms: Secondary | ICD-10-CM

## 2014-04-05 DIAGNOSIS — F323 Major depressive disorder, single episode, severe with psychotic features: Secondary | ICD-10-CM

## 2014-04-05 MED ORDER — FLUOXETINE HCL 20 MG PO CAPS: 20.0000 mg | ORAL_CAPSULE | Freq: Every day | ORAL | Status: DC

## 2014-04-05 MED ORDER — RISPERIDONE 0.25 MG PO TABS: ORAL_TABLET | ORAL | Status: DC
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
## 2014-05-03 ENCOUNTER — Encounter (HOSPITAL_COMMUNITY): Payer: Self-pay | Admitting: Psychiatry

## 2014-05-03 ENCOUNTER — Ambulatory Visit (INDEPENDENT_AMBULATORY_CARE_PROVIDER_SITE_OTHER): Payer: Medicare HMO | Admitting: Psychiatry

## 2014-05-03 VITALS — BP 110/80 | Ht 60.0 in | Wt 124.0 lb

## 2014-05-03 DIAGNOSIS — F323 Major depressive disorder, single episode, severe with psychotic features: Secondary | ICD-10-CM

## 2014-05-03 DIAGNOSIS — F333 Major depressive disorder, recurrent, severe with psychotic symptoms: Secondary | ICD-10-CM

## 2014-05-03 MED ORDER — BUPROPION HCL ER (XL) 150 MG PO TB24: 150.0000 mg | ORAL_TABLET | ORAL | Status: DC
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
## 2014-05-07 ENCOUNTER — Other Ambulatory Visit (HOSPITAL_COMMUNITY): Payer: Self-pay | Admitting: Psychiatry

## 2014-05-16 ENCOUNTER — Telehealth (HOSPITAL_COMMUNITY): Payer: Self-pay | Admitting: *Deleted

## 2014-05-16 ENCOUNTER — Other Ambulatory Visit (HOSPITAL_COMMUNITY): Payer: Self-pay | Admitting: Psychiatry

## 2014-05-16 MED ORDER — BUPROPION HCL 100 MG PO TABS: ORAL_TABLET | ORAL | Status: DC

## 2014-05-16 NOTE — Telephone Encounter (Signed)
Spoke to niece [name]. Pt's agitation worse since starting Wellbutrin XL 150. Told to d/c. In 3 days she can start Wellbutrin 50 mg qam. If she becomes too agitated to manage, call back or go to ER

## 2014-05-24 ENCOUNTER — Encounter (HOSPITAL_COMMUNITY): Payer: Self-pay | Admitting: Psychiatry

## 2014-05-24 ENCOUNTER — Ambulatory Visit (INDEPENDENT_AMBULATORY_CARE_PROVIDER_SITE_OTHER): Payer: Medicare HMO | Admitting: Psychiatry

## 2014-05-24 VITALS — BP 90/60 | Ht 60.0 in | Wt 125.0 lb

## 2014-05-24 DIAGNOSIS — F323 Major depressive disorder, single episode, severe with psychotic features: Secondary | ICD-10-CM

## 2014-05-24 DIAGNOSIS — F333 Major depressive disorder, recurrent, severe with psychotic symptoms: Secondary | ICD-10-CM

## 2014-05-24 MED ORDER — RISPERIDONE 0.25 MG PO TABS: ORAL_TABLET | ORAL | Status: DC

## 2014-05-24 MED ORDER — MIRTAZAPINE 30 MG PO TABS: 30.0000 mg | ORAL_TABLET | Freq: Every day | ORAL | Status: DC

## 2014-05-24 MED ORDER — RISPERIDONE 1 MG PO TABS: 1.0000 mg | ORAL_TABLET | Freq: Every day | ORAL | Status: DC
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
## 2014-06-21 ENCOUNTER — Ambulatory Visit (INDEPENDENT_AMBULATORY_CARE_PROVIDER_SITE_OTHER): Payer: 59 | Admitting: Psychiatry

## 2014-06-21 ENCOUNTER — Encounter (HOSPITAL_COMMUNITY): Payer: Self-pay | Admitting: Psychiatry

## 2014-06-21 VITALS — BP 120/60 | Ht 60.0 in | Wt 125.0 lb

## 2014-06-21 DIAGNOSIS — F333 Major depressive disorder, recurrent, severe with psychotic symptoms: Secondary | ICD-10-CM

## 2014-06-21 NOTE — Progress Notes (Signed)
Patient ID: Judy Suarez, female   DOB: [birth year], 80 y.o.   MRN: [id number] Patient ID: Judy Suarez, female   DOB: 05/27/1931, 80 y.o.   MRN: [id number] Patient ID: Judy Suarez, female   DOB: 12/30/1930, 80 y.o.   MRN: [id number] Patient ID: Judy Suarez, female   DOB: [birth year], 80 y.o.   MRN: [id number]  Psychiatric Assessment Adult  Patient Identification:  Judy Suarez Date of Evaluation:  06/21/2014 Chief Complaint: She is going downhill History of Chief Complaint:   Chief Complaint  Patient presents with  . Depression  . Follow-up    Anxiety Symptoms include confusion.     this patient is an 80 year old widowed white female who lives with her niece [name] in [place]. Her niece accompanies her today. She was referred by Dr. Samuel Jester, her primary physician for assessment of depression psychotic symptoms and possible dementia  Apparently the patient had problems with depression for many years. She is a poor historian and her nieces not entirely sure why this started. She's been on antidepressant medicine since her 47s and was treated primarily by her primary care physician Dr. Garner Nash. The patient's husband was an alcoholic who was verbally abusive. He died 12 years ago.  For the past 12 years the patient has generally functioned very well until one year ago. Her other niece named Lorayne Marek convinced her to go off her medication which included Celexa. Lorayne Marek is also taking control of her finances. Since then the patient has deteriorated. She's becoming increasingly depressed and unable to function or care for herself. When she was living alone she was imagining people were in her house are trying to hurt her. She would run out and flag down cars to help her. She heard voices.  She finally moved in with her niece [name] 9 months ago. She is on a low dose of Lexapro which is not helped. She was tried on several antipsychotic such Abilify which made her worse and  Seroquel which did not help. She's not Risperdal which is helped with the psychotic symptoms. She still very slowed down and depressed and obsessed about her finances. She can't get dressed he her bathe without help. She had lost 20 pounds but has gained it back. She's not had any formal testing for dementia but in the afternoons she couldn't becomes increasingly confused. She's had a brain CT at [facility] which [name] stated was reported as normal. She's not had any recent changes in her medical status  The patient and her niece [name] return after four-weeks. The patient is doing worse than ever. She's basically stop functioning and sleeps all day. She doesn't have any interest in food. She's fallen and bruised her arm and had to go to the urgent care. She makes no eye contact and virtually just stares into space. She is still trying to leave the home. I've told the niece today that we've come to the point where she needs hospitalizations and she's no longer able to function. Her niece agrees to take it in the emergency room tomorrow so we can get her medically cleared to go to [facility] Review of Systems  Constitutional: Positive for activity change.  HENT: Negative.   Eyes: Negative.   Respiratory: Negative.   Cardiovascular: Negative.  Negative for leg swelling.  Gastrointestinal: Negative.   Endocrine: Negative.   Genitourinary: Negative.   Musculoskeletal: Negative.   Skin: Negative.   Allergic/Immunologic: Negative.   Neurological: Negative.  Hematological: Negative.   Psychiatric/Behavioral: Positive for hallucinations, confusion, sleep disturbance, dysphoric mood and agitation.   Physical Exam  Depressive Symptoms: depressed mood, anhedonia, psychomotor agitation, psychomotor retardation, feelings of worthlessness/guilt, impaired memory, anxiety, weight loss,  (Hypo) Manic Symptoms:   Elevated Mood:  No Irritable Mood:  No Grandiosity:   No Distractibility:  No Labiality of Mood:  Yes Delusions:  Yes Hallucinations:  No Impulsivity:  No Sexually Inappropriate Behavior:  No Financial Extravagance:  No Flight of Ideas:  No  Anxiety Symptoms: Excessive Worry:  Yes Panic Symptoms:  Yes Agoraphobia:  No Obsessive Compulsive: No  Symptoms: None, Specific Phobias:  No Social Anxiety:  Yes  Psychotic Symptoms:  Hallucinations: Yes Auditory Visual Delusions:  Yes Paranoia:  Yes   Ideas of Reference:  No  PTSD Symptoms: Ever had a traumatic exposure:  No Had a traumatic exposure in the last month:  No Re-experiencing: No None Hypervigilance:  No Hyperarousal: No None Avoidance: No None  Traumatic Brain Injury: No  Past Psychiatric History: Diagnosis: Maj. depression with psychosis   Hospitalizations: none  Outpatient Care: Only primary care   Substance Abuse Care: none  Self-Mutilation: none  Suicidal Attempts: none  Violent Behaviors: none   Past Medical History:   Past Medical History  Diagnosis Date  . Hypertension   . Depression   . Coronary artery disease   . Arthritis   . Psychosis    History of Loss of Consciousness:  No Seizure History:  No Cardiac History:  Yes Allergies:   Allergies  Allergen Reactions  . Abilify [Aripiprazole]     agitation  . Clonazepam     Zoned out   Current Medications:  Current Outpatient Prescriptions  Medication Sig Dispense Refill  . aspirin EC 81 MG tablet Take 1 tablet (81 mg total) by mouth daily.      Marland Kitchen buPROPion (WELLBUTRIN) 100 MG tablet Take one half tablet qam  30 tablet  2  . Calcium Carb-Cholecalciferol (CALCIUM 600 + D PO) Take 1 tablet by mouth daily.      . Cyanocobalamin (B-12 PO) Take 1 tablet by mouth daily.      Marland Kitchen docusate sodium (COLACE) 100 MG capsule Take 1 capsule (100 mg total) by mouth 2 (two) times daily.  10 capsule  0  . ibuprofen (ADVIL,MOTRIN) 200 MG tablet Take 200 mg by mouth every 6 (six) hours as needed.      .  metoprolol tartrate (LOPRESSOR) 25 MG tablet Take 25 mg by mouth 2 (two) times daily.      . mirtazapine (REMERON) 30 MG tablet Take 1 tablet (30 mg total) by mouth at bedtime.  30 tablet  2  . Multiple Vitamin (MULTIVITAMIN) tablet Take 1 tablet by mouth daily.      . risperiDONE (RISPERDAL) 0.25 MG tablet Take one tablet as needed for agitation, up to 2 a day  60 tablet  2  . risperiDONE (RISPERDAL) 1 MG tablet Take 1 tablet (1 mg total) by mouth at bedtime.  30 tablet  2  . vitamin C (ASCORBIC ACID) 500 MG tablet Take 1 tablet (500 mg total) by mouth daily.  50 tablet  0   No current facility-administered medications for this visit.    Previous Psychotropic Medications:  Medication Dose  Abilify, Brintellix, lorazepam, clonazepam, Seroquel                        Substance Abuse History in the last 12 months:  Substance Age of 1st Use Last Use Amount Specific Type  Nicotine      Alcohol      Cannabis      Opiates      Cocaine      Methamphetamines      LSD      Ecstasy      Benzodiazepines      Caffeine      Inhalants      Others:                          Medical Consequences of Substance Abuse: n/a  Legal Consequences of Substance Abuse: n/a  Family Consequences of Substance Abuse: n/a  Blackouts:  No DT's:  No Withdrawal Symptoms:  No None  Social History: [place] [address] [place] Place of Birth: [place] Family Members: Lives with niece and niece's boyfriend Marital Status:  Widowed Children: None Relationships: Only with family member Education:  [employer] Graduate Educational Problems/Performance:  Religious Beliefs/Practices: Christian History of Abuse: emotional (Husband was verbally abusive alcohol) Occupational ExperNone. None. Legal History: none Hobbies/Interests: None currently  Family History:   Family History  Problem Relation Age of Onset  . Mental retardation Maternal Uncle     Mental Status  Examination/Evaluation: Objective:  Appearance: Neat and Well Groomed, drowsy, walking slowly keeps her head down using a walker   Eye Contact::  Poor  Speech:  Slow  Volume:  Decreased  Mood:  Depressed and very shut down,   Affect:  Constricted, Depressed and Flat  Thought Process:  Loose  Orientation:  Full (Time, Place, and Person)  Thought Content:  Rumination  Suicidal Thoughts:  No  Homicidal Thoughts:  No  Judgement:  Poor  Insight:  Lacking  Psychomotor Activity:  Decreased  Akathisia:  No  Handed:  Right  AIMS (if indicated):    Assets:  Social Support    Laboratory/X-Ray Psychological Evaluation(s)        Assessment:  Axis I: Major Depression, Recurrent severe and Psychotic Disorder NOS  AXIS I Major Depression, Recurrent severe and Psychotic Disorder NOS  AXIS II Deferred  AXIS III Past Medical History  Diagnosis Date  . Hypertension   . Depression   . Coronary artery disease   . Arthritis   . Psychosis      AXIS IV other psychosocial or environmental problems  AXIS V 51-60 moderate symptoms   Treatment Plan/Recommendations:  Plan of Care: Medication management   Laboratory:  Psychotherapy: Not indicated as she is too confused   Medications: The patient will continue Risperdal to 1 mg each bedtime to help with agitation and psychotic symptoms. She she will continue Wellbutrin only a 25 mg per day and increase mirtazapine to 30 mg each bedtime She will be given Risperdal 0.25 mg to use up to twice a day for agitation   Routine PRN Medications:  No  Consultations:   Safety Concerns:  She is in need to be watched closely as she tends to leave the home.   Other: Her niece will bring her to the emergency room [facility] tomorrow .she will call me first so I can alert them    Judy Suarez, Judy Schuelke, MD 7/9/201511:46 AM

## 2014-06-22 ENCOUNTER — Encounter (HOSPITAL_COMMUNITY): Payer: Self-pay | Admitting: Emergency Medicine

## 2014-06-22 ENCOUNTER — Emergency Department (HOSPITAL_COMMUNITY)
Admission: EM | Admit: 2014-06-22 | Discharge: 2014-06-23 | Disposition: A | Discharge status: Other Acute Inpatient Hospital | Payer: Medicare HMO | Attending: Emergency Medicine | Admitting: Emergency Medicine

## 2014-06-22 ENCOUNTER — Telehealth (HOSPITAL_COMMUNITY): Payer: Self-pay | Admitting: *Deleted

## 2014-06-22 DIAGNOSIS — I2510 Atherosclerotic heart disease of native coronary artery without angina pectoris: Secondary | ICD-10-CM | POA: Diagnosis not present

## 2014-06-22 DIAGNOSIS — M129 Arthropathy, unspecified: Secondary | ICD-10-CM | POA: Diagnosis not present

## 2014-06-22 DIAGNOSIS — F3289 Other specified depressive episodes: Secondary | ICD-10-CM | POA: Insufficient documentation

## 2014-06-22 DIAGNOSIS — F329 Major depressive disorder, single episode, unspecified: Secondary | ICD-10-CM | POA: Diagnosis not present

## 2014-06-22 DIAGNOSIS — I10 Essential (primary) hypertension: Secondary | ICD-10-CM | POA: Diagnosis not present

## 2014-06-22 DIAGNOSIS — Z7982 Long term (current) use of aspirin: Secondary | ICD-10-CM | POA: Diagnosis not present

## 2014-06-22 DIAGNOSIS — F29 Unspecified psychosis not due to a substance or known physiological condition: Secondary | ICD-10-CM | POA: Diagnosis not present

## 2014-06-22 DIAGNOSIS — Z79899 Other long term (current) drug therapy: Secondary | ICD-10-CM | POA: Diagnosis not present

## 2014-06-22 DIAGNOSIS — F32A Depression, unspecified: Secondary | ICD-10-CM

## 2014-06-22 DIAGNOSIS — Z9861 Coronary angioplasty status: Secondary | ICD-10-CM | POA: Diagnosis not present

## 2014-06-22 LAB — RAPID URINE DRUG SCREEN, HOSP PERFORMED
AMPHETAMINES: NOT DETECTED
BENZODIAZEPINES: NOT DETECTED
Barbiturates: NOT DETECTED
Cocaine: NOT DETECTED
OPIATES: NOT DETECTED
Tetrahydrocannabinol: NOT DETECTED

## 2014-06-22 LAB — CBC WITH DIFFERENTIAL/PLATELET
BASOS ABS: 0 10*3/uL (ref 0.0–0.1)
BASOS PCT: 0 % (ref 0–1)
EOS PCT: 3 % (ref 0–5)
Eosinophils Absolute: 0.2 10*3/uL (ref 0.0–0.7)
HEMATOCRIT: 39.9 % (ref 36.0–46.0)
Hemoglobin: 13.5 g/dL (ref 12.0–15.0)
LYMPHS PCT: 25 % (ref 12–46)
Lymphs Abs: 1.6 10*3/uL (ref 0.7–4.0)
MCH: 31 pg (ref 26.0–34.0)
MCHC: 33.8 g/dL (ref 30.0–36.0)
MCV: 91.5 fL (ref 78.0–100.0)
MONO ABS: 0.7 10*3/uL (ref 0.1–1.0)
Monocytes Relative: 11 % (ref 3–12)
Neutro Abs: 3.8 10*3/uL (ref 1.7–7.7)
Neutrophils Relative %: 61 % (ref 43–77)
Platelets: 196 10*3/uL (ref 150–400)
RBC: 4.36 MIL/uL (ref 3.87–5.11)
RDW: 13.1 % (ref 11.5–15.5)
WBC: 6.3 10*3/uL (ref 4.0–10.5)

## 2014-06-22 LAB — ETHANOL: Alcohol, Ethyl (B): <11 mg/dL (ref 0–11)

## 2014-06-22 LAB — BASIC METABOLIC PANEL
Anion gap: 10 (ref 5–15)
BUN: 15 mg/dL (ref 6–23)
CALCIUM: 9.4 mg/dL (ref 8.4–10.5)
CO2: 26 mEq/L (ref 19–32)
CREATININE: 1.02 mg/dL (ref 0.50–1.10)
Chloride: 104 mEq/L (ref 96–112)
GFR calc Af Amer: 58 mL/min — ABNORMAL LOW (ref >90)
GFR calc non Af Amer: 50 mL/min — ABNORMAL LOW (ref >90)
GLUCOSE: 80 mg/dL (ref 70–99)
Potassium: 4.3 mEq/L (ref 3.7–5.3)
Sodium: 140 mEq/L (ref 137–147)

## 2014-06-22 NOTE — ED Notes (Signed)
Pt sent from Dr. Tenny Crawoss office for psychiatirc evaluation. Per Dr. Tenny Crawoss, pt has had increased depression x1 month. Pt denies SI/HI or hearing voices at this time but pt family reports "she talks to people who aren't really there all the time." nad noted. Pt has very flat affect in triage.

## 2014-06-22 NOTE — ED Notes (Signed)
[facility] called and said they will be doing her tele assessment by 2pm.

## 2014-06-22 NOTE — ED Notes (Signed)
Pt and niece update. Advised will be admitted as inpatient for pschychiatric needs. Nad.pt asking unrelated questions to niece, "has he cussed you out yet". Niece doesn't know who she is talking about.

## 2014-06-22 NOTE — ED Notes (Signed)
Rockey SituBrenda Hundley Niece [phone]

## 2014-06-22 NOTE — Telephone Encounter (Signed)
[facility] know [facility] is [facility] for [facility]

## 2014-06-22 NOTE — BH Assessment (Addendum)
[facility] Assessment Progress Note  The following referral contacts have been made: Earlene Plater*[facility]: @ 18:13 Marjean Donnaileen advised me to sent referral; she confirmed receipt of paperwork at 18:23. *[facility]: @ 18:19 Delorise ShinerGrace reported that they will not have beds until tomorrow (7/11), but invited me to send referral; she confirmed receipt of paperwork at 18:45. [facility]: @ 18:30 Toniann FailWendy reported that they are at capacity. *[facility]: @ 18:27 Adrianne reported that they are at capacity. [facility]: @ 18:33 Judeth CornfieldStephanie reported that they are at capacity.  Doylene Canninghomas Errika Narvaiz, MA Triage Specialist 06/22/14 @ 18:46

## 2014-06-22 NOTE — ED Provider Notes (Signed)
CSN: [id number]     Arrival date & time 06/22/14  1052 History  This chart was scribed for Donnetta HutchingBrian Lyman Balingit, MD by Leone PayorSonum Patel, ED Scribe. This patient was seen in room APA04/APA04 and the patient's care was started 11:26 AM.    Chief Complaint  Patient presents with  . Depression   The history is provided by a relative. The history is limited by the condition of the patient. No language interpreter was used.   LEVEL 5 CAVEAT- Schizophrenia and Depression HPI Comments: Judy Suarez is a 80 y.o. female with past medical history of depression, psychosis who presents to the Emergency Department complaining of constant, gradually worsened depression with associated hallucinations for the past 1 year. Patient's niece states patient has a history of depression but suddenly quit taking her psych medications 1 year ago. Per niece, patient has been having gradually worsened auditory and visual hallucinations and speaks with them through the night. She reports patient has been seeing her husband who has been deceased for the past 12 years. Patient also walks into the street and flags people down. Per niece, patient is currently only taking Risperdal.    [facility]  Past Medical History  Diagnosis Date  . Hypertension   . Depression   . Coronary artery disease   . Arthritis   . Psychosis    Past Surgical History  Procedure Laterality Date  . Orif wrist fracture Right 10/26/2013  . Coronary angioplasty      PER FAMILY  . Orif wrist fracture Right 10/26/2013    Procedure: OPEN REDUCTION INTERNAL FIXATION (ORIF) WRIST FRACTURE;  Surgeon: Sharma CovertFred W Ortmann, MD;  Location: [facility] OR;  Service: Orthopedics;  Laterality: Right;   Family History  Problem Relation Age of Onset  . Mental retardation Maternal Uncle    History  Substance Use Topics  . Smoking status: Never Smoker   . Smokeless tobacco: Never Used  . Alcohol Use: No   OB History   Grav Para Term Preterm Abortions TAB SAB Ect Mult  Living                 Review of Systems  Unable to perform ROS: Psychiatric disorder      Allergies  Abilify and Clonazepam  Home Medications   Prior to Admission medications   Medication Sig Start Date End Date Taking? Authorizing Provider  aspirin EC 81 MG tablet Take 1 tablet (81 mg total) by mouth daily. 10/31/13   Laqueta LindenSuresh A Koneswaran, MD  buPROPion [facility](WELLBUTRIN) 100 MG tablet Take one half tablet qam 05/16/14   Diannia Rudereborah Ross, MD  Calcium Carb-Cholecalciferol (CALCIUM 600 + D PO) Take 1 tablet by mouth daily.    Historical Provider, MD  Cyanocobalamin (B-12 PO) Take 1 tablet by mouth daily.    Historical Provider, MD  docusate sodium (COLACE) 100 MG capsule Take 1 capsule (100 mg total) by mouth 2 (two) times daily. 10/26/13   Sharma CovertFred W Ortmann, MD  gabapentin (NEURONTIN) 100 MG capsule Take 100 mg by mouth at bedtime. 05/23/14   Historical Provider, MD  metoprolol tartrate (LOPRESSOR) 25 MG tablet Take 25 mg by mouth at bedtime.     Historical Provider, MD  mirtazapine (REMERON) 30 MG tablet Take 1 tablet (30 mg total) by mouth at bedtime. 05/24/14 05/24/15  Diannia Rudereborah Ross, MD  Multiple Vitamin (MULTIVITAMIN) tablet Take 1 tablet by mouth daily.    Historical Provider, MD  risperiDONE (RISPERDAL) 0.25 MG tablet Take one tablet as needed  for agitation, up to 2 a day 05/24/14   Diannia Ruder, MD  risperiDONE (RISPERDAL) 1 MG tablet Take 1 tablet (1 mg total) by mouth at bedtime. 05/24/14   Diannia Ruder, MD  vitamin C (ASCORBIC ACID) 500 MG tablet Take 1 tablet (500 mg total) by mouth daily. 10/26/13   Sharma Covert, MD   BP 166/83  Pulse 76  Temp(Src) 97.6 F (36.4 C) (Oral)  Resp 16  Ht 5\' 2"  (1.575 m)  Wt 126 lb (57.153 kg)  BMI 23.04 kg/m2  SpO2 95% Physical Exam  Nursing note and vitals reviewed. Constitutional:  Laying in bed with little movement.   HENT:  Head: Normocephalic and atraumatic.  Eyes: Conjunctivae and EOM are normal. Pupils are equal, round, and reactive to  light.  Neck: Normal range of motion. Neck supple.  Cardiovascular: Normal rate, regular rhythm and normal heart sounds.   Pulmonary/Chest: Effort normal and breath sounds normal.  Abdominal: Soft. Bowel sounds are normal.  Musculoskeletal: Normal range of motion.  Neurological: She is alert.  Skin: Skin is warm and dry.  Psychiatric: Her behavior is normal. She exhibits a depressed mood.  Depressed with flat affect.     ED Course  Procedures (including critical care time)  DIAGNOSTIC STUDIES: Oxygen Saturation is 94% on RA, adequate by my interpretation.    COORDINATION OF CARE: 11:33 AM Discussed treatment plan with pt and family at bedside and they agreed to plan.   Labs Review Labs Reviewed  BASIC METABOLIC PANEL - Abnormal; Notable for the following:    GFR calc non Af Amer 50 (*)    GFR calc Af Amer 58 (*)    All other components within normal limits  CBC WITH DIFFERENTIAL  ETHANOL  URINE RAPID DRUG SCREEN (HOSP PERFORMED)    Imaging Review No results found.   EKG Interpretation None      MDM   Final diagnoses:  Depression   Patient has long-standing depression.  Will get psychiatry consultation.  Disc c Dr Estell Harpin  I personally performed the services described in this documentation, which was scribed in my presence. The recorded information has been reviewed and is accurate.   Donnetta Hutching, MD 06/22/14 [phone]

## 2014-06-22 NOTE — BH Assessment (Signed)
Tele Assessment Note   Judy Suarez is an 80 y.o. female with hx of psychosis. She presents to [facility] accompanied by her niece whom she has resided with x1 year. Additionally, she was [facility] to [facility] by her psychiatrist Dr. Tenny Craw with [facility]. Patient presents with many symptoms: depression, failure to thrive, increased agitation, confusion, and also paranoid behaviors. Patient was living independently up to 8-9 months ago and due to her inability to care for herself properly she moved in with her niece. Since moving in with her niece [name] she exhibits signs of depression evidenced by loss of interest in usual pleasures, fatigue, agitation, and hopelessness. She also has vegetative symptoms including decreased bathing, grooming, and staying in the bed all day. Her niece reports that she sleeps up to 14 hours per day at times. She also eats very little and in the past has loss a tremendous amount of weight. She has reportedly gained some of that weight back. Her niece sts that patient is often confused and has noticed this more so at night. Patient denies suicidal ideations and/or history. No issues related to self injurious behaviors. Patient reports that she has AVH's. However, when asked she doesn't know how to provide a description of her psychosis. Her niece sts that patient is paranoid about others stealing her money. She has also witnessed patient running into the street waving down cars stating, "Help Help my family is trying to hurt me". No alcohol or drug use reported. Patient seeks treatment from her psychiatrist Dr. Tenny Craw regularly with several medication changes over the past year.  She has of inpatient treatment.  Writer contacted patient's psychiatrist Dr. Tenny Craw and she is recommending inpatient Geri Psych for stabilization. TTS to seek Geri Psych placement.   Axis I: Major Depression, Recurrent severe with psychotic features Axis II: Deferred Axis III:  Past Medical History   Diagnosis Date  . Hypertension   . Depression   . Coronary artery disease   . Arthritis   . Psychosis    Axis IV: other psychosocial or environmental problems, problems related to social environment, problems with access to health care services and problems with primary support group Axis V: 31-40 impairment in reality testing  Past Medical History:  Past Medical History  Diagnosis Date  . Hypertension   . Depression   . Coronary artery disease   . Arthritis   . Psychosis     Past Surgical History  Procedure Laterality Date  . Orif wrist fracture Right 10/26/2013  . Coronary angioplasty      PER FAMILY  . Orif wrist fracture Right 10/26/2013    Procedure: OPEN REDUCTION INTERNAL FIXATION (ORIF) WRIST FRACTURE;  Surgeon: Sharma Covert, MD;  Location: [facility] OR;  Service: Orthopedics;  Laterality: Right;    Family History:  Family History  Problem Relation Age of Onset  . Mental retardation Maternal Uncle     Social History:  reports that she has never smoked. She has never used smokeless tobacco. She reports that she does not drink alcohol or use illicit drugs.  Additional Social History:  Alcohol / Drug Use Pain Medications: SEE MAR Prescriptions: SEE MAR Over the Counter: SEE MAR History of alcohol / drug use?: No history of alcohol / drug abuse  CIWA: CIWA-Ar BP: 166/83 mmHg Pulse Rate: 76 COWS:    Allergies:  Allergies  Allergen Reactions  . Abilify [Aripiprazole]     agitation  . Clonazepam     Zoned out  Home Medications:  (Not in a hospital admission)  OB/GYN Status:  No LMP recorded. Patient is postmenopausal.  General Assessment Data Location of Assessment: [facility] ED Is this a Tele or Face-to-Face Assessment?: Face-to-Face Is this an Initial Assessment or a Re-assessment for this encounter?: Initial Assessment Living Arrangements: Other (Comment) (lives with niece) Can pt return to current living arrangement?: No Admission Status: Voluntary Is  patient capable of signing voluntary admission?: No Transfer from: Acute Hospital Referral Source: Self/Family/Friend     [facility] Crisis Care Plan Living Arrangements: Other (Comment) (lives with niece) Name of Psychiatrist:  (Dr. Tenny Craw at [facility] outpatient clinic ) Name of Therapist:  (No therapist )  Education Status Is patient currently in school?: No  Risk to self Suicidal Ideation: No Suicidal Intent: No Is patient at risk for suicide?: No Suicidal Plan?: No Access to Means: No What has been your use of drugs/alcohol within the last 12 months?:  (n/a) Previous Attempts/Gestures: No How many times?:  (0) Other Self Harm Risks:  (n/a) Intentional Self Injurious Behavior: None Family Suicide History: No Recent stressful life event(s): Other (Comment);Financial Problems (Niece is POA taken over finances, moved in w/ niece 1 yr ago) Persecutory voices/beliefs?: No Depression: Yes Depression Symptoms: Feeling angry/irritable;Feeling worthless/self pity;Loss of interest in usual pleasures;Fatigue;Isolating Substance abuse history and/or treatment for substance abuse?: No Suicide prevention information given to non-admitted patients: Not applicable  Risk to Others Homicidal Ideation: No Thoughts of Harm to Others: No Current Homicidal Intent: No Current Homicidal Plan: No Access to Homicidal Means: No Identified Victim:  (n/a) History of harm to others?: No Assessment of Violence: None Noted Violent Behavior Description:  (patient is calm and cooperative ) Does patient have access to weapons?: No Criminal Charges Pending?: No Does patient have a court date: No  Psychosis Hallucinations: Auditory;Visual (pt admits but unable to describe AVH's) Delusions: Grandiose;Persecutory (paranoid with family members stating they steal her $)  Mental Status Report Appear/Hygiene: Disheveled Eye Contact: Fair Motor Activity: Freedom of movement Speech:  Logical/coherent Level of Consciousness: Alert Mood: Depressed Affect: Appropriate to circumstance Anxiety Level: Severe Thought Processes: Coherent Judgement: Unimpaired Orientation: Person;Time;Situation;Place Obsessive Compulsive Thoughts/Behaviors: None  Cognitive Functioning Concentration: Decreased Memory: Recent Intact;Remote Intact IQ: Average Insight: Fair Impulse Control: Good Appetite: Poor Weight Loss:  (over 100 pounds 1 yr ago; 20 pounds recent but gained back ) Weight Gain:  (none reported ) Sleep: Decreased Total Hours of Sleep:  (varies ) Vegetative Symptoms: None  ADLScreening [facility] Assessment Services) Patient's cognitive ability adequate to safely complete daily activities?: Yes Patient able to express need for assistance with ADLs?: Yes Independently performs ADLs?: Yes (appropriate for developmental age)  Prior Inpatient Therapy Prior Inpatient Therapy: No Prior Therapy Dates:  (n/a) Prior Therapy Facilty/Provider(s): n/a Reason for Treatment: n/a  Prior Outpatient Therapy Prior Outpatient Therapy: Yes Prior Therapy Dates:  (current ) Prior Therapy Facilty/Provider(s):  (Dr. Tenny Craw -psychiatrist ) Reason for Treatment:  (med mgmt.)  ADL Screening (condition at time of admission) Patient's cognitive ability adequate to safely complete daily activities?: Yes Is the patient deaf or have difficulty hearing?: No Does the patient have difficulty seeing, even when wearing glasses/contacts?: No Does the patient have difficulty concentrating, remembering, or making decisions?: No Patient able to express need for assistance with ADLs?: Yes Does the patient have difficulty dressing or bathing?: No Independently performs ADLs?: Yes (appropriate for developmental age) Communication: Independent Dressing (OT): Independent Grooming: Independent Feeding: Independent Bathing: Independent Toileting: Independent In/Out Bed: Independent Walks  in Home:  Independent Does the patient have difficulty walking or climbing stairs?: No Weakness of Legs: None Weakness of Arms/Hands: None  Home Assistive Devices/Equipment Home Assistive Devices/Equipment: None    Abuse/Neglect Assessment (Assessment to be complete while patient is alone) Physical Abuse: Denies Verbal Abuse: Denies Sexual Abuse: Denies Exploitation of patient/patient's resources: Denies Self-Neglect: Denies Values / Beliefs Cultural Requests During Hospitalization: None Spiritual Requests During Hospitalization: None   Advance Directives (For Healthcare) Advance Directive: Patient does not have advance directive Nutrition Screen- [facility] Patient's home diet: Regular  Additional Information 1:1 In Past 12 Months?: No CIRT Risk: No Elopement Risk: No Does patient have medical clearance?: Yes     Disposition:  Disposition Initial Assessment Completed for this Encounter: Yes Disposition of Patient: Inpatient treatment program (Pending Geri Psych placement ) Type of inpatient treatment program: Adult  Octaviano Battyerry, Dayla Gasca Mona 06/22/2014 4:13 PM

## 2014-06-23 NOTE — Progress Notes (Addendum)
[facility] and can fax for review.  Referral faxed  1200 [facility]  [facility] called and made aware, nurse will call [facility]TS back regarding transportation. [facility]seAnn [facility] MD is Dr. Quintin AltoVeser and asked that pt not be transported until after 3p.  Information was passed on to pt's nurse in [facility] along with report number.  Tomi BambergerMariya Zacarias Krauter Disposition MHT

## 2014-06-23 NOTE — BH Assessment (Signed)
Per Dr. Tenny Crawoss, Pt needs placement at a geriatric-psychiatry unit. Contacted the following facilities for placement:  PT INFORMATION RECEIVED AND UNDER REVIEW[facility], per Marylene LandAngela  [facility] [place]: [facility], [facility], per [facility], per Garen Gramsonya  UNABLE TO CONTACT: [facility]   [address]Bailey Kolbe Ellis Aimee Heldman Jr, [facility], [facility] Triage Specialist [phone]

## 2014-06-23 NOTE — ED Notes (Signed)
Assisted patient with bedside commode.  

## 2014-06-23 NOTE — ED Notes (Signed)
Patient moved to room with better visual access to Nurse's station. Report given to Schering-PloughCrystal, Charity fundraiserN. Awaiting psychiatric bed placement.

## 2014-06-23 NOTE — ED Notes (Signed)
Notified pt's niece Rockey SituBrenda Hundley that [facility].

## 2014-06-23 NOTE — Progress Notes (Addendum)
[id number] pt has been [facility] by Dr. Aliene Beamsosato.  [phone] per [facility]ace [place] [facility] was received but has not been reviewed.  States that referral will be reviewed today and run by MD.   Tomi BambergerMariya Dulce Martian Disposition MHT

## 2014-07-05 ENCOUNTER — Encounter (HOSPITAL_COMMUNITY): Payer: Self-pay | Admitting: Psychiatry

## 2014-07-05 ENCOUNTER — Ambulatory Visit (INDEPENDENT_AMBULATORY_CARE_PROVIDER_SITE_OTHER): Payer: 59 | Admitting: Psychiatry

## 2014-07-05 VITALS — BP 130/80 | Ht 60.0 in | Wt 122.0 lb

## 2014-07-05 DIAGNOSIS — F333 Major depressive disorder, recurrent, severe with psychotic symptoms: Secondary | ICD-10-CM

## 2014-07-05 MED ORDER — BUPROPION HCL ER (XL) 150 MG PO TB24: 150.0000 mg | ORAL_TABLET | Freq: Every day | ORAL | Status: DC

## 2014-07-05 MED ORDER — OLANZAPINE 10 MG PO TABS: 10.0000 mg | ORAL_TABLET | Freq: Every day | ORAL | Status: DC

## 2014-07-05 NOTE — Progress Notes (Signed)
Patient ID: ALYNE MARTINSON, female   DOB: 27-Sep-1931, 80 y.o.   MRN: [id number] Patient ID: KAYLIEE ATIENZA, female   DOB: 12/07/1931, 80 y.o.   MRN: [id number] Patient ID: GERALDENE EISEL, female   DOB: Apr 27, 1931, 80 y.o.   MRN: [id number] Patient ID: TSERING LEAMAN, female   DOB: Oct 13, 1931, 80 y.o.   MRN: [id number] Patient ID: MALAYNA NOORI, female   DOB: Jun 30, 1931, 80 y.o.   MRN: [id number]  Psychiatric Assessment Adult  Patient Identification:  DAMONIE FURNEY Date of Evaluation:  07/05/2014 Chief Complaint: She is a little better History of Chief Complaint:   Chief Complaint  Patient presents with  . Depression  . Agitation  . Follow-up    Anxiety Symptoms include confusion.     this patient is an 80 year old widowed white female who lives with her niece [name] in [place]. Her niece accompanies her today. She was referred by Dr. Samuel Jester, her primary physician for assessment of depression psychotic symptoms and possible dementia  Apparently the patient had problems with depression for many years. She is a poor historian and her nieces not entirely sure why this started. She's been on antidepressant medicine since her 19s and was treated primarily by her primary care physician Dr. Garner Nash. The patient's husband was an alcoholic who was verbally abusive. He died 12 years ago.  For the past 12 years the patient has generally functioned very well until one year ago. Her other niece named Lorayne Marek convinced her to go off her medication which included Celexa. Lorayne Marek is also taking control of her finances. Since then the patient has deteriorated. She's becoming increasingly depressed and unable to function or care for herself. When she was living alone she was imagining people were in her house are trying to hurt her. She would run out and flag down cars to help her. She heard voices.  She finally moved in with her niece [name] 9 months ago. She is on a low dose of Lexapro which is not  helped. She was tried on several antipsychotic such Abilify which made her worse and Seroquel which did not help. She's not Risperdal which is helped with the psychotic symptoms. She still very slowed down and depressed and obsessed about her finances. She can't get dressed he her bathe without help. She had lost 20 pounds but has gained it back. She's not had any formal testing for dementia but in the afternoons she couldn't becomes increasingly confused. She's had a brain CT at [facility] which [name] stated was reported as normal. She's not had any recent changes in her medical status  The patient and her niece [name] return after four-weeks. The patient was hospitalized at [facility] geropsychiatry unit from July 11 to July 18. Her Risperdal and Remeron were stopped and Wellbutrin was increased to 150 mg XL. She was only given melatonin to help with sleep. She did better at the hospital and was more alert and participating in activities and slept fairly well at night. She did well the first 3 days back but again obsessed and again about her finances. For the last 3 nights she has not slept at all and has been wandering the house. We spoke at length about the need for her [name] and Lorayne Marek to meet with an attorney and hash things out about finances. Today she is too drowsy and not clearing off but she is more alert than she was in the past. She still not  eating and has lost another 4 pounds. I told [name] we could add Zyprexa at night to help with appetite and sleep Review of Systems  Constitutional: Positive for activity change.  HENT: Negative.   Eyes: Negative.   Respiratory: Negative.   Cardiovascular: Negative.  Negative for leg swelling.  Gastrointestinal: Negative.   Endocrine: Negative.   Genitourinary: Negative.   Musculoskeletal: Negative.   Skin: Negative.   Allergic/Immunologic: Negative.   Neurological: Negative.   Hematological: Negative.   Psychiatric/Behavioral:  Positive for hallucinations, confusion, sleep disturbance, dysphoric mood and agitation.   Physical Exam  Depressive Symptoms: depressed mood, anhedonia, psychomotor agitation, psychomotor retardation, feelings of worthlessness/guilt, impaired memory, anxiety, weight loss,  (Hypo) Manic Symptoms:   Elevated Mood:  No Irritable Mood:  No Grandiosity:  No Distractibility:  No Labiality of Mood:  Yes Delusions:  Yes Hallucinations:  No Impulsivity:  No Sexually Inappropriate Behavior:  No Financial Extravagance:  No Flight of Ideas:  No  Anxiety Symptoms: Excessive Worry:  Yes Panic Symptoms:  Yes Agoraphobia:  No Obsessive Compulsive: No  Symptoms: None, Specific Phobias:  No Social Anxiety:  Yes  Psychotic Symptoms:  Hallucinations: Yes Auditory Visual Delusions:  Yes Paranoia:  Yes   Ideas of Reference:  No  PTSD Symptoms: Ever had a traumatic exposure:  No Had a traumatic exposure in the last month:  No Re-experiencing: No None Hypervigilance:  No Hyperarousal: No None Avoidance: No None  Traumatic Brain Injury: No  Past Psychiatric History: Diagnosis: Maj. depression with psychosis   Hospitalizations: none  Outpatient Care: Only primary care   Substance Abuse Care: none  Self-Mutilation: none  Suicidal Attempts: none  Violent Behaviors: none   Past Medical History:   Past Medical History  Diagnosis Date  . Hypertension   . Depression   . Coronary artery disease   . Arthritis   . Psychosis    History of Loss of Consciousness:  No Seizure History:  No Cardiac History:  Yes Allergies:   Allergies  Allergen Reactions  . Abilify [Aripiprazole]     agitation  . Clonazepam     Zoned out   Current Medications:  Current Outpatient Prescriptions  Medication Sig Dispense Refill  . aspirin EC 81 MG tablet Take 1 tablet (81 mg total) by mouth daily.      Marland Kitchen buPROPion (WELLBUTRIN XL) 150 MG 24 hr tablet Take 1 tablet (150 mg total) by mouth  daily.  30 tablet  2  . Calcium Carb-Cholecalciferol (CALCIUM 600 + D PO) Take 1 tablet by mouth daily.      . Cyanocobalamin (B-12 PO) Take 1 tablet by mouth daily.      Marland Kitchen docusate sodium (COLACE) 100 MG capsule Take 1 capsule (100 mg total) by mouth 2 (two) times daily.  10 capsule  0  . metoprolol tartrate (LOPRESSOR) 25 MG tablet Take 25 mg by mouth at bedtime.       Marland Kitchen OLANZapine (ZYPREXA) 10 MG tablet Take 1 tablet (10 mg total) by mouth at bedtime.  30 tablet  2  . vitamin C (ASCORBIC ACID) 500 MG tablet Take 1 tablet (500 mg total) by mouth daily.  50 tablet  0   No current facility-administered medications for this visit.    Previous Psychotropic Medications:  Medication Dose  Abilify, Brintellix, lorazepam, clonazepam, Seroquel                        Substance Abuse History in the last  12 months: Substance Age of 1st Use Last Use Amount Specific Type  Nicotine      Alcohol      Cannabis      Opiates      Cocaine      Methamphetamines      LSD      Ecstasy      Benzodiazepines      Caffeine      Inhalants      Others:                          Medical Consequences of Substance Abuse: n/a  Legal Consequences of Substance Abuse: n/a  Family Consequences of Substance Abuse: n/a  Blackouts:  No DT's:  No Withdrawal Symptoms:  No None  Social History: Current [place] Residence: [place] [address] [place] Place [place] Birth: [place] Family Members: Lives with niece and niece's boyfriend Marital Status:  Widowed Children: None Relationships: Only with family member Education:  [employer] Graduate Educational Problems/Performance:  Religious Beliefs/Practices: Christian History of Abuse: emotional (Husband was verbally abusive alcohol) Occupational ExperNone. None. Legal History: none Hobbies/Interests: None currently  Family History:   Family History  Problem Relation Age of Onset  . Mental retardation Maternal Uncle     Mental Status  Examination/Evaluation: Objective:  Appearance: Neat and Well Groomed,    Eye Contact::  Poor  Speech:  Slow  Volume:  Decreased  Mood:  Depressed but more talkative and clear in her thinking that she was in the past   Affect:  Constricted, Depressed and Flat  Thought Process: More organized   Orientation:  Full (Time, Place, and Person)  Thought Content:  Rumination  Suicidal Thoughts:  No  Homicidal Thoughts:  No  Judgement:  Poor  Insight:  Lacking  Psychomotor Activity:  Decreased  Akathisia:  No  Handed:  Right  AIMS (if indicated):    Assets:  Social Support    Laboratory/X-Ray Psychological Evaluation(s)        Assessment:  Axis I: Major Depression, Recurrent severe and Psychotic Disorder NOS  AXIS I Major Depression, Recurrent severe and Psychotic Disorder NOS  AXIS II Deferred  AXIS III Past Medical History  Diagnosis Date  . Hypertension   . Depression   . Coronary artery disease   . Arthritis   . Psychosis      AXIS IV other psychosocial or environmental problems  AXIS V 51-60 moderate symptoms   Treatment Plan/Recommendations:  Plan of Care: Medication management   Laboratory:  Psychotherapy: Not indicated as she is too confused   Medications: The patient will continue Wellbutrin XL 150 mg every morning. Zyprexa 10 mg will be started but I've cautioned Steward DroneBrenda to only start 5 mg initially at bedtime   Routine PRN Medications:  No  Consultations:   Safety Concerns:  She is in need to be watched closely as she tends to leave the home.   Other: She'll return in four-weeks    [place], Gavin PoundEBORAH, MD 7/23/20159:55 AM

## 2014-07-09 ENCOUNTER — Telehealth (HOSPITAL_COMMUNITY): Payer: Self-pay | Admitting: *Deleted

## 2014-07-09 NOTE — Telephone Encounter (Signed)
Sending to Dr. Lucianne MussKumar for review in Dr. Tenny Crawoss' absence

## 2014-07-10 NOTE — Telephone Encounter (Signed)
Contacted caregiver Ms. Hundley at Dr. Remus BlakeKumar's (in Dr. Tenny Crawoss' absence) request: Per Dr.Kumar, if patient continues to be agitated and continues to fall, take to ED. Per Ms. Hundley-has been caregiver for pt for 13 months.In past, when on this antidepressant at this dose-Wellbutrin 150 mg- same thing happened.[facility] started this at this does. Last time, Dr. Tenny Crawoss had her stop the medicine for 3 days and restart at 50 mg.Ms. Gary FleetHundley plans to do that this time. Informed her that this information will be placed in pt chart and sent to Dr, Tenny Crawoss. States she will also contact Dr. Tenny Crawoss on Monday for questions

## 2014-07-16 ENCOUNTER — Telehealth (HOSPITAL_COMMUNITY): Payer: Self-pay | Admitting: *Deleted

## 2014-07-16 ENCOUNTER — Encounter (HOSPITAL_COMMUNITY): Payer: Self-pay | Admitting: Emergency Medicine

## 2014-07-16 ENCOUNTER — Emergency Department (HOSPITAL_COMMUNITY)
Admission: EM | Admit: 2014-07-16 | Discharge: 2014-07-16 | Disposition: A | Discharge status: Home / Self Care | Payer: 59 | Attending: Emergency Medicine | Admitting: Emergency Medicine

## 2014-07-16 ENCOUNTER — Emergency Department (HOSPITAL_COMMUNITY): Payer: 59

## 2014-07-16 DIAGNOSIS — R609 Edema, unspecified: Secondary | ICD-10-CM | POA: Insufficient documentation

## 2014-07-16 DIAGNOSIS — Z8781 Personal history of (healed) traumatic fracture: Secondary | ICD-10-CM | POA: Insufficient documentation

## 2014-07-16 DIAGNOSIS — I2510 Atherosclerotic heart disease of native coronary artery without angina pectoris: Secondary | ICD-10-CM | POA: Insufficient documentation

## 2014-07-16 DIAGNOSIS — F0390 Unspecified dementia without behavioral disturbance: Secondary | ICD-10-CM | POA: Insufficient documentation

## 2014-07-16 DIAGNOSIS — F329 Major depressive disorder, single episode, unspecified: Secondary | ICD-10-CM | POA: Insufficient documentation

## 2014-07-16 DIAGNOSIS — Z79899 Other long term (current) drug therapy: Secondary | ICD-10-CM | POA: Insufficient documentation

## 2014-07-16 DIAGNOSIS — I10 Essential (primary) hypertension: Secondary | ICD-10-CM | POA: Insufficient documentation

## 2014-07-16 DIAGNOSIS — Z7982 Long term (current) use of aspirin: Secondary | ICD-10-CM | POA: Insufficient documentation

## 2014-07-16 DIAGNOSIS — M129 Arthropathy, unspecified: Secondary | ICD-10-CM | POA: Insufficient documentation

## 2014-07-16 DIAGNOSIS — Z008 Encounter for other general examination: Secondary | ICD-10-CM | POA: Insufficient documentation

## 2014-07-16 DIAGNOSIS — Z9861 Coronary angioplasty status: Secondary | ICD-10-CM | POA: Insufficient documentation

## 2014-07-16 DIAGNOSIS — F3289 Other specified depressive episodes: Secondary | ICD-10-CM | POA: Insufficient documentation

## 2014-07-16 DIAGNOSIS — R627 Adult failure to thrive: Secondary | ICD-10-CM | POA: Insufficient documentation

## 2014-07-16 LAB — CBC WITH DIFFERENTIAL/PLATELET
BASOS ABS: 0 10*3/uL (ref 0.0–0.1)
Basophils Relative: 0 % (ref 0–1)
Eosinophils Absolute: 0 10*3/uL (ref 0.0–0.7)
Eosinophils Relative: 0 % (ref 0–5)
HCT: 40.1 % (ref 36.0–46.0)
Hemoglobin: 13.6 g/dL (ref 12.0–15.0)
Lymphocytes Relative: 18 % (ref 12–46)
Lymphs Abs: 1.4 10*3/uL (ref 0.7–4.0)
MCH: 31.1 pg (ref 26.0–34.0)
MCHC: 33.9 g/dL (ref 30.0–36.0)
MCV: 91.6 fL (ref 78.0–100.0)
Monocytes Absolute: 0.8 10*3/uL (ref 0.1–1.0)
Monocytes Relative: 10 % (ref 3–12)
NRBC: 0 /100{WBCs}
Neutro Abs: 5.5 10*3/uL (ref 1.7–7.7)
Neutrophils Relative %: 70 % (ref 43–77)
PLATELETS: 221 10*3/uL (ref 150–400)
RBC: 4.38 MIL/uL (ref 3.87–5.11)
RDW: 13 % (ref 11.5–15.5)
WBC: 7.5 10*3/uL (ref 4.0–10.5)

## 2014-07-16 LAB — COMPREHENSIVE METABOLIC PANEL
ALT: 31 U/L (ref 0–35)
AST: 81 U/L — AB (ref 0–37)
Albumin: 3.2 g/dL — ABNORMAL LOW (ref 3.5–5.2)
Alkaline Phosphatase: 110 U/L (ref 39–117)
Anion gap: 14 (ref 5–15)
BUN: 20 mg/dL (ref 6–23)
CALCIUM: 9.6 mg/dL (ref 8.4–10.5)
CO2: 21 mEq/L (ref 19–32)
Chloride: 102 mEq/L (ref 96–112)
Creatinine, Ser: 0.9 mg/dL (ref 0.50–1.10)
GFR calc Af Amer: 68 mL/min — ABNORMAL LOW (ref >90)
GFR calc non Af Amer: 58 mL/min — ABNORMAL LOW (ref >90)
Glucose, Bld: 82 mg/dL (ref 70–99)
Potassium: 4.4 mEq/L (ref 3.7–5.3)
Sodium: 137 mEq/L (ref 137–147)
Total Bilirubin: 0.6 mg/dL (ref 0.3–1.2)
Total Protein: 6.6 g/dL (ref 6.0–8.3)

## 2014-07-16 LAB — BLOOD GAS, ARTERIAL
Acid-base deficit: 2.5 mmol/L — ABNORMAL HIGH (ref 0.0–2.0)
Bicarbonate: 21 mEq/L (ref 20.0–24.0)
DRAWN BY: 23534
FIO2: 0.21 %
O2 CONTENT: 21 L/min
O2 Saturation: 97.5 %
PCO2 ART: 31.2 mmHg — AB (ref 35.0–45.0)
PH ART: 7.443 (ref 7.350–7.450)
PO2 ART: 91.8 mmHg (ref 80.0–100.0)
Patient temperature: 37
TCO2: 18.3 mmol/L (ref 0–100)

## 2014-07-16 LAB — URINALYSIS, ROUTINE W REFLEX MICROSCOPIC
GLUCOSE, UA: NEGATIVE mg/dL
Hgb urine dipstick: NEGATIVE
Ketones, ur: 40 mg/dL — AB
LEUKOCYTES UA: NEGATIVE
NITRITE: NEGATIVE
PROTEIN: NEGATIVE mg/dL
Specific Gravity, Urine: >1.03 — ABNORMAL HIGH (ref 1.005–1.030)
Urobilinogen, UA: 1 mg/dL (ref 0.0–1.0)
pH: 6 (ref 5.0–8.0)

## 2014-07-16 LAB — RAPID URINE DRUG SCREEN, HOSP PERFORMED
Amphetamines: NOT DETECTED
Barbiturates: NOT DETECTED
Benzodiazepines: NOT DETECTED
Cocaine: NOT DETECTED
OPIATES: NOT DETECTED
Tetrahydrocannabinol: NOT DETECTED

## 2014-07-16 LAB — AMMONIA: Ammonia: 24 umol/L (ref 11–60)

## 2014-07-16 LAB — LACTIC ACID, PLASMA: LACTIC ACID, VENOUS: 0.8 mmol/L (ref 0.5–2.2)

## 2014-07-16 LAB — TROPONIN I

## 2014-07-16 NOTE — ED Provider Notes (Signed)
CSN: [id number]     Arrival date & time 07/16/14  1144 History   First MD Initiated Contact with Patient 07/16/14 1226    This chart was scribed for No att. providers found by Marica Otter, ED Scribe. This patient was seen in room APA16A/APA16A and the patient's care was started at 12:29 PM. LEVEL 5 CAVEAT Chief Complaint  Patient presents with  . Psychiatric Evaluation   HPI HPI Comments: Judy Suarez is a 80 y.o. female, with a history of dementia, depression and psychosis, who presents to the Emergency Department for a psychiatric evaluation. Pt's niece reports that pt has been refusing to eat, drink, or get up for the past three days. Pt's niece denies any other Sx. Per niece, pt's current behavior is somewhat like prior past behavior.   Per pt's niece, pt takes the following meds at home: Gabapentin, Bupropion and Olanzapine.   Patient is noncontributory to history taking.  Of note, patient was evaluated in early July for [facility] placement.  Per the niece, patient was inpatient at [facility] and discharged with the recommendation for SNF placement.  The niece has not received referral information yet.  Past Medical History  Diagnosis Date  . Hypertension   . Depression   . Coronary artery disease   . Arthritis   . Psychosis    Past Surgical History  Procedure Laterality Date  . Orif wrist fracture Right 10/26/2013  . Coronary angioplasty      PER FAMILY  . Orif wrist fracture Right 10/26/2013    Procedure: OPEN REDUCTION INTERNAL FIXATION (ORIF) WRIST FRACTURE;  Surgeon: Sharma Covert, MD;  Location: [facility] OR;  Service: Orthopedics;  Laterality: Right;   Family History  Problem Relation Age of Onset  . Mental retardation Maternal Uncle    History  Substance Use Topics  . Smoking status: Never Smoker   . Smokeless tobacco: Never Used  . Alcohol Use: No   OB History   Grav Para Term Preterm Abortions TAB SAB Ect Mult Living                 Review of Systems  Unable  to perform ROS: Dementia      Allergies  Abilify and Clonazepam  Home Medications   Prior to Admission medications   Medication Sig Start Date End Date Taking? Authorizing Provider  aspirin EC 81 MG tablet Take 1 tablet (81 mg total) by mouth daily. 10/31/13  Yes Laqueta Linden, MD  buPROPion (WELLBUTRIN) 100 MG tablet Take 50 mg by mouth every morning.   Yes Historical Provider, MD  Calcium Carb-Cholecalciferol (CALCIUM 600 + D PO) Take 1 tablet by mouth daily.   Yes Historical Provider, MD  Cyanocobalamin (B-12 PO) Take 1 tablet by mouth daily.   Yes Historical Provider, MD  docusate sodium (COLACE) 100 MG capsule Take 1 capsule (100 mg total) by mouth 2 (two) times daily. 10/26/13  Yes Sharma Covert, MD  gabapentin (NEURONTIN) 100 MG capsule Take 100 mg by mouth at bedtime.   Yes Historical Provider, MD  metoprolol tartrate (LOPRESSOR) 25 MG tablet Take 25 mg by mouth daily.    Yes Historical Provider, MD  OLANZapine (ZYPREXA) 10 MG tablet Take 1 tablet (10 mg total) by mouth at bedtime. 07/05/14  Yes Diannia Ruder, MD  vitamin C (ASCORBIC ACID) 500 MG tablet Take 1 tablet (500 mg total) by mouth daily. 10/26/13  Yes Sharma Covert, MD   Triage Vitals: BP 136/77  Pulse  70  Temp(Src) 98 F (36.7 C) (Oral)  Resp 16  Ht 5\' 2"  (1.575 m)  Wt 120 lb (54.432 kg)  BMI 21.94 kg/m2  SpO2 95% Physical Exam  Nursing note and vitals reviewed. Constitutional: She is oriented to person, place, and time. No distress.  Elderly, eyes closed  HENT:  Head: Normocephalic and atraumatic.  MM dry  Eyes: Pupils are equal, round, and reactive to light.  Cardiovascular: Normal rate, regular rhythm and normal heart sounds.   No murmur heard. Pulmonary/Chest: Effort normal and breath sounds normal. No respiratory distress. She has no wheezes.  Abdominal: Soft. Bowel sounds are normal. There is no tenderness. There is no rebound.  Musculoskeletal: She exhibits edema.  Neurological: She is  oriented to person, place, and time.  Somnolent but arousal, 5/5 strength in all four extremities, follows simple commands  Skin: Skin is warm and dry.  Psychiatric:  Flat affect, withdrawn    ED Course  Procedures (including critical care time) DIAGNOSTIC STUDIES: Oxygen Saturation is 95% on RA, adequate by my interpretation.    COORDINATION OF CARE: 12:32PM-Discussed treatment plan which includes imaging, labs and EKG with pt at bedside and pt agreed to plan.   Labs Review Labs Reviewed  COMPREHENSIVE METABOLIC PANEL - Abnormal; Notable for the following:    Albumin 3.2 (*)    AST 81 (*)    GFR calc non Af Amer 58 (*)    GFR calc Af Amer 68 (*)    All other components within normal limits  URINALYSIS, ROUTINE W REFLEX MICROSCOPIC - Abnormal; Notable for the following:    Color, Urine ORANGE (*)    APPearance HAZY (*)    Specific Gravity, Urine >1.030 (*)    Bilirubin Urine MODERATE (*)    Ketones, ur 40 (*)    All other components within normal limits  BLOOD GAS, ARTERIAL - Abnormal; Notable for the following:    pCO2 arterial 31.2 (*)    Acid-base deficit 2.5 (*)    All other components within normal limits  CBC WITH DIFFERENTIAL  URINE RAPID DRUG SCREEN (HOSP PERFORMED)  TROPONIN I  LACTIC ACID, PLASMA  AMMONIA    Imaging Review Ct Head Wo Contrast  07/16/2014   CLINICAL DATA:  Altered mental status.  EXAM: CT HEAD WITHOUT CONTRAST  TECHNIQUE: Contiguous axial images were obtained from the base of the skull through the vertex without intravenous contrast.  COMPARISON:  CT of the head Apr 20, 2013  FINDINGS: Moderately motion degraded examination required repeat imaging.  The ventricles and sulci are normal for age. No intraparenchymal hemorrhage, mass effect nor midline shift. Patchy supratentorial white matter hypodensities are less than expected for patient's age and though non-specific suggest sequelae of chronic small vessel ischemic disease. No acute large vascular  territory infarcts.  No abnormal extra-axial fluid collections. Basal cisterns are patent. Moderate calcific atherosclerosis of the carotid siphons. Dolicoectatic basilar artery.  No skull fracture. The included ocular globes and orbital contents are non-suspicious. The mastoid aircells and included paranasal sinuses are well-aerated. Moderate temporomandibular osteoarthrosis.  IMPRESSION: No acute intracranial process.  Dolichoectatic basilar artery with otherwise unremarkable CT of the head for age.   Electronically Signed   By: Awilda Metro   On: 07/16/2014 13:31     EKG Interpretation   Date/Time:  Monday July 16 2014 12:50:30 EDT Ventricular Rate:  69 PR Interval:  155 QRS Duration: 91 QT Interval:  393 QTC Calculation: 421 R Axis:   31 Text  Interpretation:  Sinus rhythm Borderline T abnormalities, anterior  leads Confirmed by HORTON  MD, COURTNEY ([id number]) on 07/16/2014 2:43:28 PM      MDM   Final diagnoses:  Adult failure to thrive    Patient brought in by her niece for evaluation and placement.  Nontoxic and nonfocal on exam.  Difficulty to fully exam 2/2 to not participating in exam.  FUll AMS work-up initiated and largely unremarkable.  No evidence of organic illness causing decreased appetite and FTT.  I have reviewed notes from prior admission and transfer.  Do not feel further psychiatric evaluation would benefit patient.  SW consulted for placement.  Per SW, patient would be self-pay.  They are getting DSS involved regarding guardianship and POA.  THey request home health and nursing consults.  While patient is being assessed for placement, she will return with her niece's home in the meantime.  After history, exam, and medical workup I feel the patient has been appropriately medically screened and is safe for discharge home. Pertinent diagnoses were discussed with the patient. Patient was given return precautions.   I personally performed the services described in this  documentation, which was scribed in my presence. The recorded information has been reviewed and is accurate.    Shon Batonourtney F Horton, MD 07/16/14 2012

## 2014-07-16 NOTE — ED Notes (Signed)
Family called EMS d/t pt has worsening symptoms of depression with decreased appetite and pt has hx of psychiatric placement for depression . On arrival pt able to answer questions appropriately. Pt states she is just not hungry.

## 2014-07-16 NOTE — Progress Notes (Signed)
CM [facility]. CM spoke with pts niece who is caregiver and explained HH services. [facility] stay with pt during the night or during the day. CM offered private duty agency list and niece turned list down as well since she does not have the means to pay for private duty care. No futhur CM needs noted.

## 2014-07-16 NOTE — Telephone Encounter (Signed)
Pt neice states pt is refusing to eat or walk. Neice also state pt is unresponsive to them. Neice would like a call back and for Dr. Tenny Crawoss to [facility]..[phone].

## 2014-07-16 NOTE — Discharge Instructions (Signed)
Failure to Thrive, Adult °Adult failure to thrive is a condition that some older people develop. People with this condition are able to do fewer and fewer activities over time. They may lose interest in being with friends or may not want to eat or drink. This is not a normal part of aging. Many things can cause this. Health problems, long-term disease, depression, bad eating habits, cognitive impairment, or disability may play a role in the development of this condition. Most of the time, it is important to treat whatever is causing failure to thrive. Sometimes, though, it might not be possible to treat the condition. The person could be nearing the end of life. Then, treatment could make the person suffer longer. °CAUSES °Sometimes, no specific cause can be found. Factors that have been linked to failure to thrive include: °· Diseases and medical conditions, such as: °¨ Cancer. °¨ Diabetes. °¨ Stomach and intestinal (gastrointestinal) problems. °¨ Lung disease. °¨ Liver disease. °¨ Kidney disease. °¨ Heart problems. °¨ Thyroid disease. °¨ Neurologic problems. °¨ Vitamin deficiencies. °· Disability. This may be the result of: °¨ A broken hip. °¨ A stroke. °¨ Very bad arthritis. °¨ Infections that last a long time. °¨ A long recovery from a surgery. °¨ Mental health issues. °¨ Medicines. °¨ Eating problems. °· Medicine for certain conditions. These conditions include: °¨ Parkinson's disease. °¨ Seizure disorder. °¨ Anxiety. °¨ Pain. °¨ High blood pressure. °¨ Depression. °¨ Infections. °SYMPTOMS °· Losing weight (more than 5% of total body weight). °· Getting more tired than usual after an activity. °· Having trouble getting up after sitting. °· Not being hungry or thirsty. °· Not getting out of bed. °· Not wanting to do usual activities. °· Being depressed. °· Getting infections often. °· Having bedsores. °· Taking a long time to recover after an injury or a surgery. °· Weakness. °DIAGNOSIS °A physical exam can help  a caregiver decide if someone has adult failure to thrive. This may include questions about the person's health presently and in the past. It also may include questions about behavior and mood, such as: °· Has activity changed? °· Does the person seem sad? °· Are eating habits different? °The caregiver may ask for a list of all medicines taken because certain medicines can lead to this condition. The list should include prescription and over-the-counter medicines. The caregiver will likely order some tests. These may include: °· Blood tests to check for infection, certain diseases, deficiencies, hormone levels, malnutrition, or dehydration. °· Urine tests to check for urinary tract infection or kidney failure. °· Imaging tests. Examples are an X-ray, a computed tomography (CT) scan, and magnetic resonance imaging (MRI). °· Hearing tests. °· Vision tests. °· Cognitive tests to check thinking ability. °· Activity tests to see if the person can do basic tasks like bathing and dressing. There also are tests to check if someone can shop, cook, or move around safely. °The caregiver will check if the person is eating enough healthy food. This may include: °· Checking weight. °· Having blood tested for cholesterol and protein levels. °· Seeing if anything else might be making it hard to eat (tooth problems, poorly fitted dentures, trouble swallowing). °The person may need to see a specialist to help with diagnosis or treatment. These specialists may include a speech therapist, physical therapist, occupational therapist, dietitian, or social worker.  °TREATMENT °Treatment for adult failure to thrive depends on the cause. Caregivers also must decide if a treatment has a good chance of working. It   often takes a team of caregivers to find the right treatment. Options may include: °· Treatments to cure a disease that can cause adult failure to thrive. °· Talk therapy or medicine to treat depression. °· A better diet. Eating more  often, adding nutritional supplements between meals, or taking vitamins may be suggested. Sometimes, medicine is prescribed to boost appetite. °· Medicine changes or stopping a medicine. °· Physical therapy. °· Moving to a place that offers more aid. °HOME CARE INSTRUCTIONS °What needs to be done at home varies from person to person. This will depend on what caused the condition and how it is treated. However, basic guidelines include: °· Taking any medicine prescribed by the caregiver. Following the directions carefully is important. °· Eating healthy foods. There should be enough calories in each meal. Ask the caregiver if vitamins or nutritional supplements should be taken between meals. Consider talking with a dietitian. °· Exercising. Strength training is important. A physical therapist can help set up an exercise program that fits the person. °· Making sure the person is safe at home. °· Talking with caregivers about what should be done if the person can no longer make decisions for himself or herself. °SEEK MEDICAL CARE IF: °· There are any questions about medicines. °· There are questions about the effects of treatment. °· The person is not able to eat well. °· The person is not able to move around. °· The person feels very sad or hopeless. °SEEK IMMEDIATE MEDICAL CARE IF:  °· The person has thoughts of ending his or her life. °· The person cannot eat or drink. °· The person does not get out of bed. °· Staying at home is no longer safe. °· The person has a fever. °Document Released: 02/22/2012 Document Reviewed: 02/22/2012 °Exit[employer]® Patient Information ©2015 [employer]. This information is not intended to replace advice given to you by your health care provider. Make sure you discuss any questions you have with your health care provider. ° °

## 2014-07-16 NOTE — ED Notes (Signed)
MD at bedside. 

## 2014-07-16 NOTE — Clinical Social Work Psychosocial (Signed)
Clinical Social Work Department BRIEF PSYCHOSOCIAL ASSESSMENT 07/16/2014  Patient:  Judy Suarez, Judy Suarez     Account Number:  [id number]     Admit date:  07/16/2014  Clinical Social Worker:  Wyatt Haste  Date/Time:  07/16/2014 03:51 PM  Referred by:  Physician  Date Referred:  07/16/2014 Referred for  SNF Placement   Other Referral:   Interview type:  Family Other interview type:   niece/caregiver: Judy Suarez    PSYCHOSOCIAL DATA Living Status:  FAMILY Admitted from facility:   Level of care:   Primary support name:  Judy Suarez Primary support relationship to patient:  FAMILY Degree of support available:   supportive, but cannot continue to take care of pt with current needs    CURRENT CONCERNS Current Concerns  Post-Acute Placement   Other Concerns:   POA is Judy Suarez.    SOCIAL WORK ASSESSMENT / PLAN CSW met with Judy Suarez, pt's niece at bedside. Pt slept through entire assessment. Per Judy Suarez, pt has no children and several nieces are next of kin. Judy Suarez reports pt has lived with her and her boyfriend for the past year. Pt had requested to live with them and they agreed. Pt's niece (Judy Suarez) Judy Suarez is POA and pays some of pt's bills but is only one who has access to pt's account outside of one small account for pt's living expenses. Judy Suarez reports that they do not speak to each other and initially said that Judy Suarez would not agree to placement because of the money. Judy Suarez had arranged for pt to go to [place] last year and when pt refused, Judy Suarez reports pt told her that she had "washed her hands of her." Judy Suarez quit work to care for pt. She has been taking her to outpatient therapy two times a week, but pt says the pain in her legs is too bad.   Pt was at [facility] for one week in June. Judy Suarez states that pt was diagnosed with moderate dementia while there. The physician there discussed the possibility of placement with Judy Suarez and she felt this  was becoming necessary. Judy Suarez reports she has an FL2 at home completed by Dr. Melina Copa, pt's PCP. She sees Dr. Harrington Challenger at [facility] once a month. Since Friday, pt has been refusing to eat or walk. Judy Suarez reports she occasionally will do this, but it has been continuous since Friday. She indicates pt has been depressed "practically all her life." She also has history of psychosis. Pt tends to wander and they have had to put alarms on all doors.   Discussed plan with Judy Suarez. She is aware that [facility] requires authorization for placement at SNF and agreed that this was most appropriate level of care as pt's care needs change. Discussed that pt may need memory care unit due to wandering. Judy Suarez would like to pursue placement but understands that pt will have to pay privately at this point. Judy Suarez believes she has a substantial amount of money that [facility] has access to. Judy Suarez requested CSW to call DSS to notify them of situation which CSW did. Spoke with Carlis Stable who took report and said that someone would follow up with Judy Suarez tomorrow. Judy Suarez intended to go to DSS herself to report issue anyway. Threasa Beards is aware that sisters do not speak to each other and that Judy Suarez is not interested in making decisions if this is needed. Judy Suarez was concerned about assist with bathing pt at home and CSW discussed home health with MD at her request. However,  when CM met with her, she refused this service.   Assessment/plan status:  Referral to [employer] Other assessment/ plan:   Information/referral to community resources:   DSS  SNF list    PATIENT'S/FAMILY'S RESPONSE TO PLAN OF CARE: Judy Suarez is willing to take pt home as she feels that something may change now with DSS involvement. She feels pt needs to be placed and she can no longer continue to take care of her at home, but does not have access to pt's funds. DSS to follow up tomorrow. Updated MD and RN.       Benay Pike,  [place]

## 2014-07-16 NOTE — ED Notes (Signed)
Social worker Diannia Ruder(Kara) at bedside at this time.

## 2014-07-16 NOTE — Telephone Encounter (Signed)
[facility], ER called

## 2014-07-16 NOTE — Telephone Encounter (Signed)
[facility] will go [facility] today for evaluation

## 2014-07-16 NOTE — ED Notes (Signed)
Social worker stated the plan is for the pt to go home with the current caregiver and DSS will follow up with pt at home per [place].

## 2014-07-16 NOTE — ED Notes (Signed)
Niece present at bedside. PT lived with niece for over a year now. Niece states pt has had a decrease in appetite and more lethargic since Friday.

## 2014-08-06 ENCOUNTER — Telehealth (HOSPITAL_COMMUNITY): Payer: Self-pay | Admitting: *Deleted

## 2014-08-07 ENCOUNTER — Ambulatory Visit (HOSPITAL_COMMUNITY): Payer: Self-pay | Admitting: Psychiatry

## 2014-08-07 NOTE — Telephone Encounter (Signed)
noted 

## 2015-01-21 ENCOUNTER — Ambulatory Visit: Payer: Self-pay | Admitting: Cardiovascular Disease

## 2015-01-22 ENCOUNTER — Encounter: Payer: Self-pay | Admitting: *Deleted

## 2015-01-22 ENCOUNTER — Encounter: Payer: Self-pay | Admitting: Cardiovascular Disease

## 2015-01-22 ENCOUNTER — Ambulatory Visit (INDEPENDENT_AMBULATORY_CARE_PROVIDER_SITE_OTHER): Payer: Medicare Other | Admitting: Cardiovascular Disease

## 2015-01-22 VITALS — BP 100/68 | HR 70 | Ht 62.0 in | Wt 101.0 lb

## 2015-01-22 DIAGNOSIS — I2510 Atherosclerotic heart disease of native coronary artery without angina pectoris: Secondary | ICD-10-CM

## 2015-01-22 DIAGNOSIS — E785 Hyperlipidemia, unspecified: Secondary | ICD-10-CM

## 2015-01-22 DIAGNOSIS — I10 Essential (primary) hypertension: Secondary | ICD-10-CM

## 2015-01-22 DIAGNOSIS — I2699 Other pulmonary embolism without acute cor pulmonale: Secondary | ICD-10-CM

## 2015-01-22 NOTE — Progress Notes (Signed)
Patient ID: Judy Suarez, female   DOB: November 18, 1931, 81 y.o.   MRN: [id number]      SUBJECTIVE: The patient is an 81 year old female with a history of ischemic heart disease, normal ejection fraction, and multiple cardiac risk factors. The patient had a stress test done in January of 2011 which was within normal limits with an ejection fraction of 71%. The patient has a history of severe single-vessel coronary artery disease status post drug-eluting stent placement to the right coronary artery in 2009. She also has hypertension, dyslipidemia, and remote tobacco abuse. She was reportedly diagnosed with a pulmonary embolism in August 2015 at [facility] and is now taking Xarelto 15 mg daily. She currently denies chest pain, palpitations, leg swelling, and shortness of breath. She apparently stayed at a rehab facility for 48 days as per her two daughters.  Review of Systems: As per "subjective", otherwise negative.  Allergies  Allergen Reactions  . Abilify [Aripiprazole]     agitation  . Clonazepam     Zoned out    Current Outpatient Prescriptions  Medication Sig Dispense Refill  . aspirin EC 81 MG tablet Take 1 tablet (81 mg total) by mouth daily.    Marland Kitchen buPROPion (WELLBUTRIN) 100 MG tablet Take 50 mg by mouth every morning.    . Calcium Carb-Cholecalciferol (CALCIUM 600 + D PO) Take 1 tablet by mouth daily.    . Cyanocobalamin (B-12 PO) Take 1 tablet by mouth daily.    Marland Kitchen docusate sodium (COLACE) 100 MG capsule Take 1 capsule (100 mg total) by mouth 2 (two) times daily. 10 capsule 0  . gabapentin (NEURONTIN) 100 MG capsule Take 100 mg by mouth at bedtime.    . metoprolol tartrate (LOPRESSOR) 25 MG tablet Take 25 mg by mouth daily.     Marland Kitchen OLANZapine (ZYPREXA) 10 MG tablet Take 1 tablet (10 mg total) by mouth at bedtime. 30 tablet 2  . vitamin C (ASCORBIC ACID) 500 MG tablet Take 1 tablet (500 mg total) by mouth daily. 50 tablet 0   No current facility-administered medications for this  visit.    Past Medical History  Diagnosis Date  . Hypertension   . Depression   . Coronary artery disease   . Arthritis   . Psychosis     Past Surgical History  Procedure Laterality Date  . Orif wrist fracture Right 10/26/2013  . Coronary angioplasty      PER FAMILY  . Orif wrist fracture Right 10/26/2013    Procedure: OPEN REDUCTION INTERNAL FIXATION (ORIF) WRIST FRACTURE;  Surgeon: Sharma Covert, MD;  Location: [facility] OR;  Service: Orthopedics;  Laterality: Right;    History   Social History  . Marital Status: Widowed    Spouse Name: N/A    Number of Children: N/A  . Years of Education: N/A   Occupational History  . Not on file.   Social History Main Topics  . Smoking status: Never Smoker   . Smokeless tobacco: Never Used  . Alcohol Use: No  . Drug Use: No  . Sexual Activity: No   Other Topics Concern  . Not on file   Social History Narrative  . No narrative on file   Weight 101 lb (45.813 kg) Height  (1.575 m)   BP 100/68  Pulse 70    PHYSICAL EXAM General: NAD HEENT: Normal. Neck: No JVD, no thyromegaly. Lungs: Clear to auscultation bilaterally with normal respiratory effort. CV: Nondisplaced PMI.  Regular rate and rhythm, normal  S1/S2, no S3/S4, no murmur. No pretibial or periankle edema.  No carotid bruit.  Normal pedal pulses.  Abdomen: Soft, nontender, no distention.  Neurologic: Alert and oriented.  Psych: Flat affect. Skin: Normal. Musculoskeletal: No gross deformities. Extremities: No clubbing or cyanosis.   ECG: Most recent ECG reviewed.      ASSESSMENT AND PLAN: 1. CAD: Symptomatically stable. Continue ASA and metoprolol. Intolerant to statins. 2. Essential HTN: Well controlled on present therapy. No changes. 3. Hyperlipidemia: Reportedly intolerant to statin therapy. Will continue to manage with diet. 4. Pulmonary embolism: Continue Xarelto 15 mg daily.  Dispo: f/u 1 year.  Prentice DockerSuresh Koneswaran, M.D., F.A.C.C.

## 2015-01-22 NOTE — Patient Instructions (Signed)
Continue all current medications. Your physician wants you to follow up in:  1 year.  You will receive a reminder letter in the mail one-two months in advance.  If you don't receive a letter, please call our office to schedule the follow up appointment   

## 2016-01-15 DEATH — deceased

## 2016-02-24 IMAGING — CT CT HEAD W/O CM
1 of 2 series · 14 of 30 positions shown, 18 images · non-contrast
Comparison: CT of the head April 20, 2013

CLINICAL DATA: Altered mental status.

EXAM:
CT HEAD WITHOUT CONTRAST
TECHNIQUE: Contiguous axial images were obtained from the base of the skull
through the vertex without intravenous contrast.

[Series 2: headseq 4.8 h37s · axial · 0.43mm/px · z∈[+77,+222]mm · 14 of 36 slices shown, 18 images]
[im 3/36  brain]
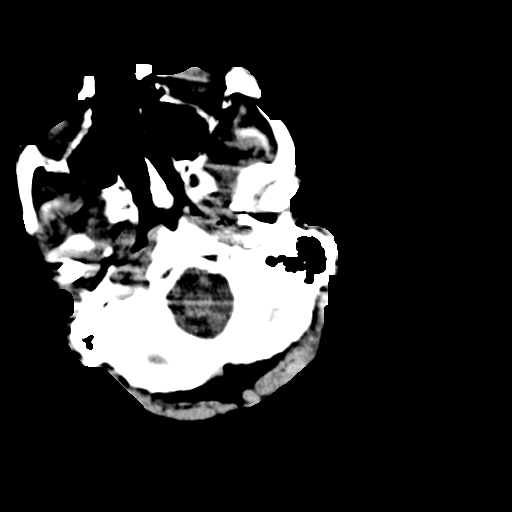
[im 3/36  bone]
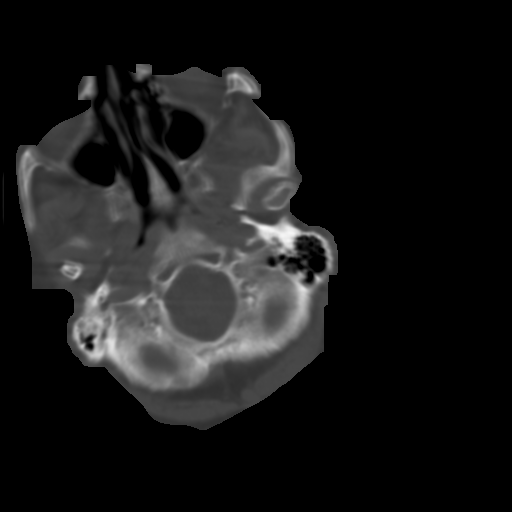
[im 5/36  brain]
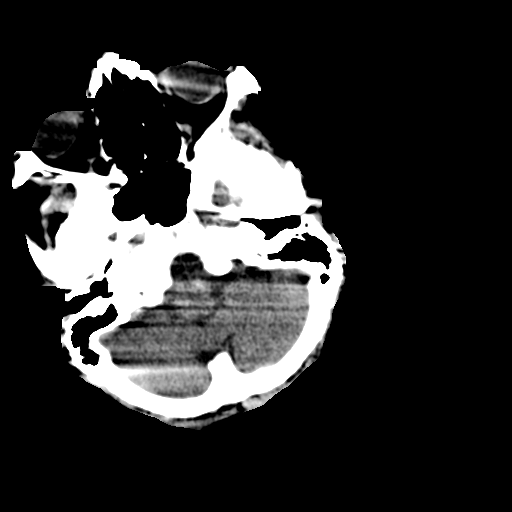
[im 8/36  brain]
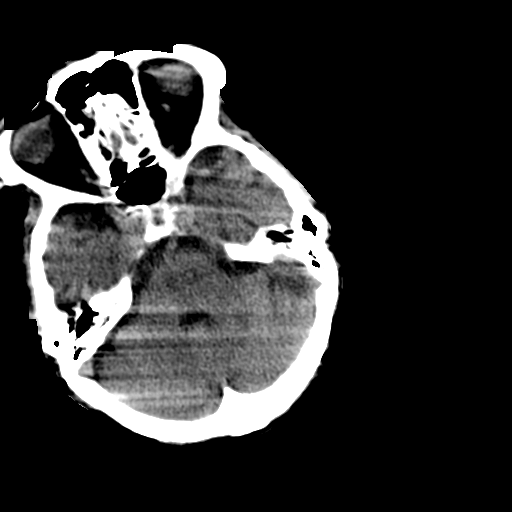
[im 10/36  brain]
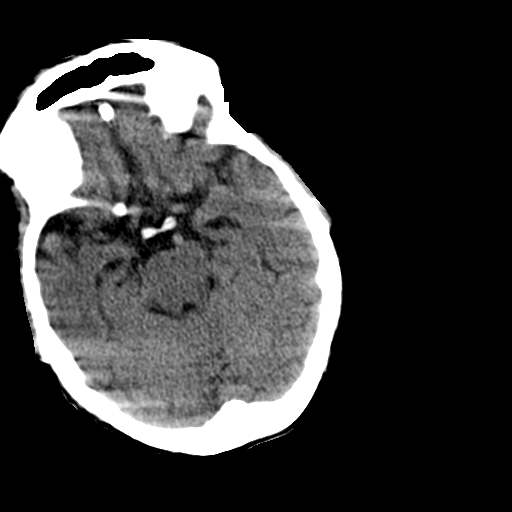
[im 12/36  brain]
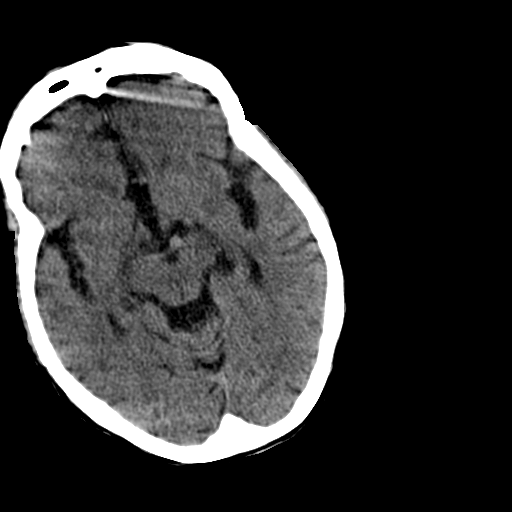
[im 12/36  bone]
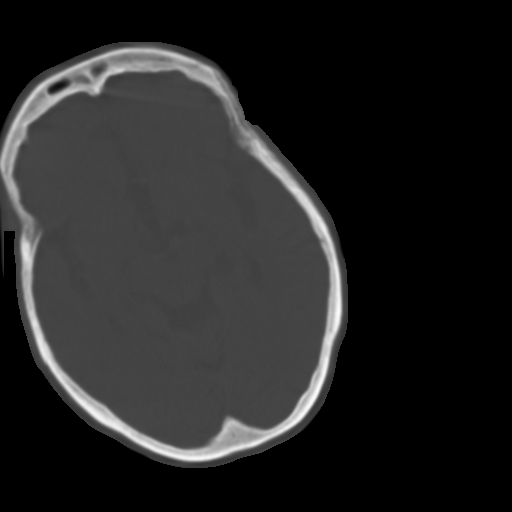
[im 15/36  brain]
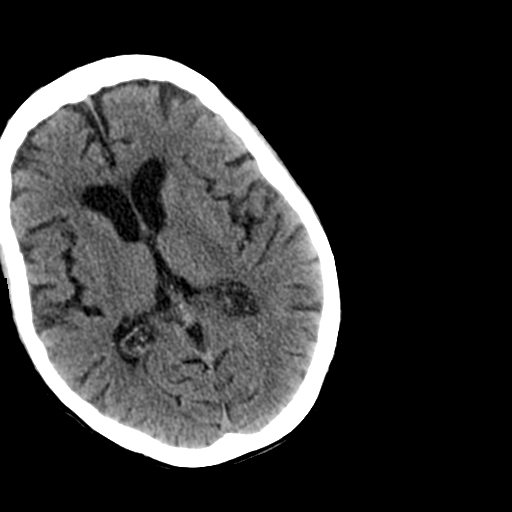
[im 17/36  brain]
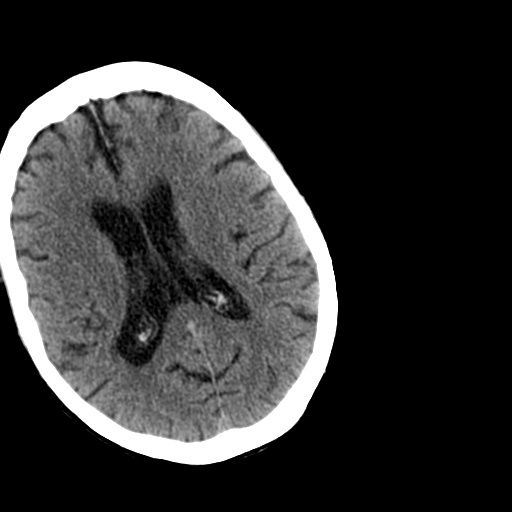
[im 19/36  brain]
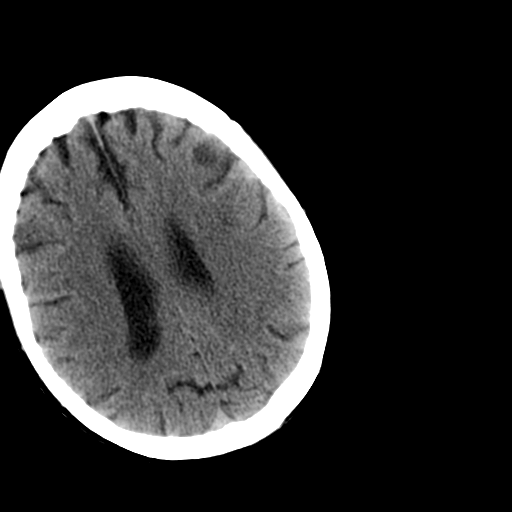
[im 22/36  brain]
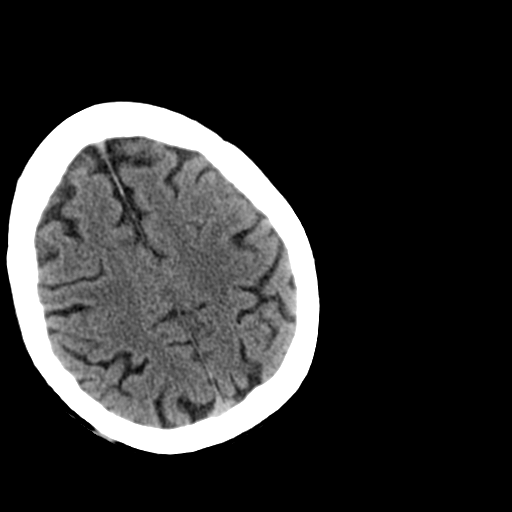
[im 22/36  bone]
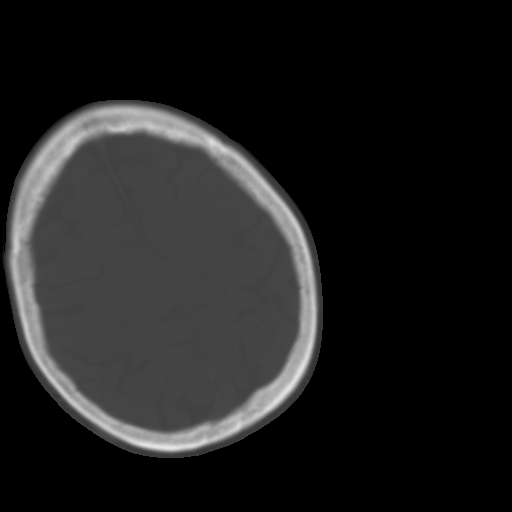
[im 24/36  brain]
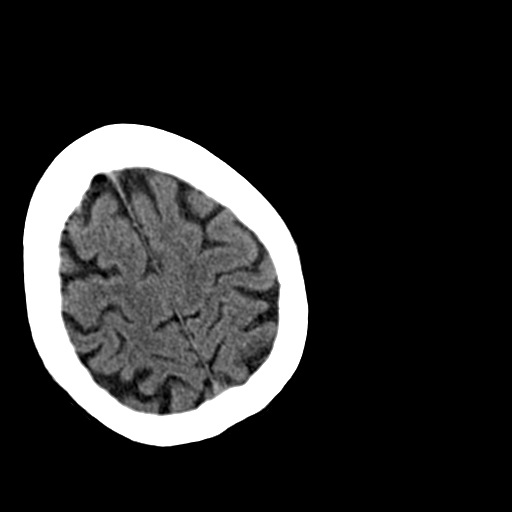
[im 26/36  brain]
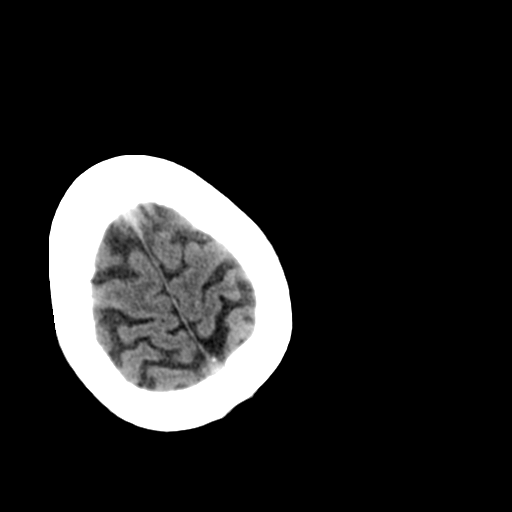
[im 29/36  brain]
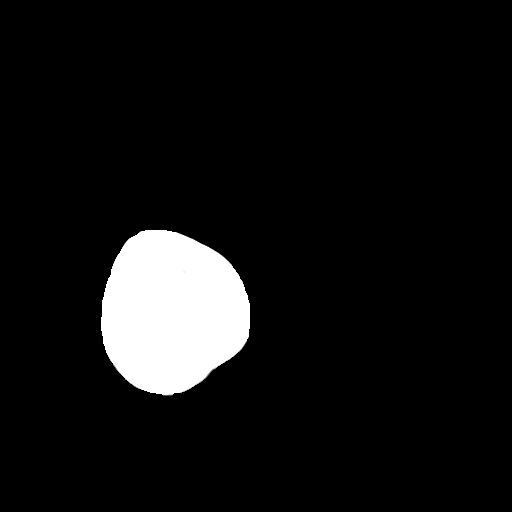
[im 31/36  brain]
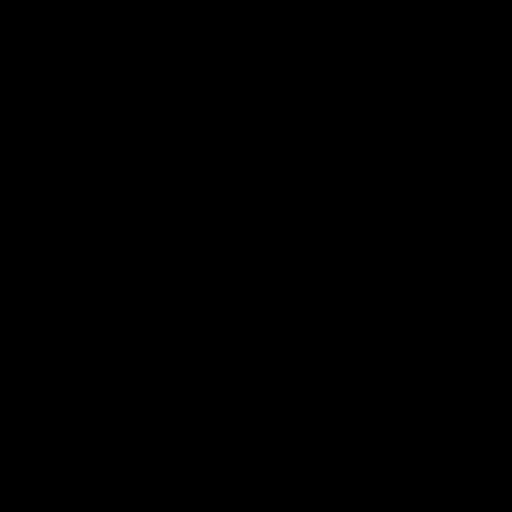
[im 31/36  bone]
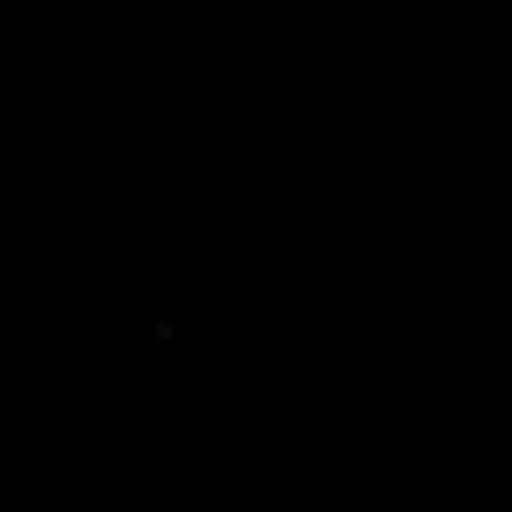
[im 33/36  brain]
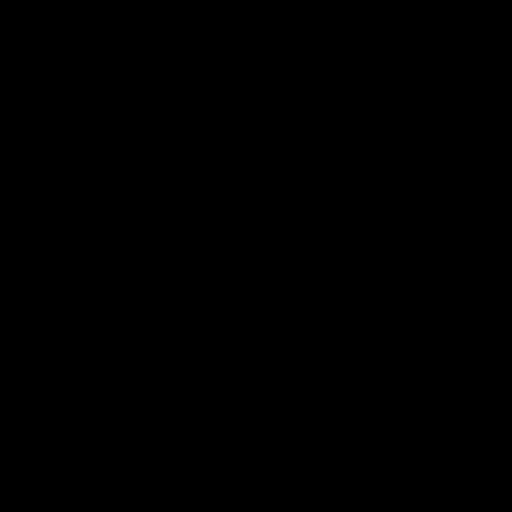

[14 of 30 positions shown; findings below may reference images not displayed]

FINDINGS: Moderately motion degraded examination required repeat imaging.

The ventricles and sulci are normal for age. No intraparenchymal
hemorrhage, mass effect nor midline shift. Patchy supratentorial
white matter hypodensities are less than expected for patient's age
and though non-specific suggest sequelae of chronic small vessel
ischemic disease. No acute large vascular territory infarcts.

No abnormal extra-axial fluid collections. Basal cisterns are
patent. Moderate calcific atherosclerosis of the carotid siphons.
Dolicoectatic basilar artery.

No skull fracture. The included ocular globes and orbital contents
are non-suspicious. The mastoid aircells and included paranasal
sinuses are well-aerated. Moderate temporomandibular osteoarthrosis.
IMPRESSION: No acute intracranial process.

Dolichoectatic basilar artery with otherwise unremarkable CT of the
head for age.

  By: Yessie Osuna
